# Patient Record
Sex: Male | Born: 1960
Health system: Southern US, Community
[De-identification: ages and names within clinical notes are randomized; demographics above are authoritative.]

## PROBLEM LIST (undated history)

## (undated) DIAGNOSIS — I10 Essential (primary) hypertension: Secondary | ICD-10-CM

## (undated) DIAGNOSIS — R7303 Prediabetes: Secondary | ICD-10-CM

## (undated) DIAGNOSIS — E7800 Pure hypercholesterolemia, unspecified: Secondary | ICD-10-CM

## (undated) DIAGNOSIS — Z720 Tobacco use: Secondary | ICD-10-CM

## (undated) DIAGNOSIS — J302 Other seasonal allergic rhinitis: Secondary | ICD-10-CM

## (undated) HISTORY — DX: Tobacco use: Z72.0

## (undated) HISTORY — PX: DENTAL SURGERY: SHX609

## (undated) HISTORY — DX: Other seasonal allergic rhinitis: J30.2

## (undated) HISTORY — DX: Essential (primary) hypertension: I10

## (undated) HISTORY — DX: Prediabetes: R73.03

## (undated) HISTORY — DX: Pure hypercholesterolemia, unspecified: E78.00

---

## 2017-03-23 ENCOUNTER — Encounter (INDEPENDENT_AMBULATORY_CARE_PROVIDER_SITE_OTHER): Payer: Self-pay | Admitting: Physician Assistant

## 2017-03-23 ENCOUNTER — Ambulatory Visit (INDEPENDENT_AMBULATORY_CARE_PROVIDER_SITE_OTHER): Payer: Medicaid Other | Admitting: Physician Assistant

## 2017-03-23 ENCOUNTER — Ambulatory Visit (HOSPITAL_COMMUNITY)
Admission: RE | Admit: 2017-03-23 | Discharge: 2017-03-23 | Disposition: A | Discharge status: Home / Self Care | Payer: Medicaid Other | Source: Ambulatory Visit | Attending: Physician Assistant | Admitting: Physician Assistant

## 2017-03-23 VITALS — BP 136/89 | HR 64 | Temp 98.7°F | Wt 180.4 lb

## 2017-03-23 DIAGNOSIS — L409 Psoriasis, unspecified: Secondary | ICD-10-CM

## 2017-03-23 MED ORDER — PREDNISONE 10 MG (48) PO TBPK: ORAL_TABLET | ORAL | 0 refills | Status: DC

## 2017-03-23 MED FILL — ?PREDNISONE 10 MG TABLET: 10 | 12 days supply | Qty: 48 | Fill #0

## 2017-03-23 NOTE — Progress Notes (Signed)
   Subjective:  Patient ID: Jared Porter, male    DOB: 01/13/1961  Age: 56 y.o. MRN: [id number]  CC: psoriasis  HPI Jared Porter is a 56 y.o. male with a medical hx of psoriasis presents for treatment of psoriasis. Onset of psoriasis 7 years ago.  Previously received treatment in his [place] country [place] in the form of injections and pills. Psoriasis located on the arms, legs, and some on back and torso. Denies f/c/n/v, pain, GI sxs, GU sxs, CP, SOB, HA, abdominal pain.   * Interpreter services used.  ROS Review of Systems  Constitutional: Negative for chills, fever and malaise/fatigue.  Eyes: Negative for blurred vision.  Respiratory: Negative for shortness of breath.   Cardiovascular: Negative for chest pain and palpitations.  Gastrointestinal: Negative for abdominal pain and nausea.  Genitourinary: Negative for dysuria and hematuria.  Musculoskeletal: Negative for joint pain and myalgias.  Skin: Negative for rash.       Plaques   Neurological: Negative for tingling and headaches.  Psychiatric/Behavioral: Negative for depression. The patient is not nervous/anxious.     Objective:  BP 136/89 (BP Location: Left Arm, Patient Position: Sitting, Cuff Size: Normal)   Pulse 64   Temp 98.7 F (37.1 C) (Oral)   Wt 180 lb 6.4 oz (81.8 kg)   SpO2 97%   BP/Weight 03/23/2017  Systolic BP 136  Diastolic BP 89  Wt. (Lbs) 180.4      Physical Exam  Constitutional: He is oriented to person, place, and time.  Well developed, well nourished, NAD, polite  HENT:  Head: Normocephalic and atraumatic.  Eyes: No scleral icterus.  Cardiovascular: Normal rate, regular rhythm and normal heart sounds.   Pulmonary/Chest: Effort normal and breath sounds normal.  Musculoskeletal: He exhibits no edema.  Neurological: He is alert and oriented to person, place, and time. No cranial nerve deficit. Coordination normal.  Skin:  Large patches of silver scale on arms, legs, abdomen,  and back  Psychiatric: He has a normal mood and affect. His behavior is normal. Thought content normal.  Vitals reviewed.    Assessment & Plan:    1. Psoriasis - HIV antibody - DG Chest 2 View; Future - Comprehensive metabolic panel - CBC with Differential - predniSONE (STERAPRED UNI-PAK 48 TAB) 10 MG (48) TBPK tablet; Take as directed  Dispense: 48 tablet; Refill: 0 - Ambulatory referral to Dermatology   Meds ordered this encounter  Medications  . predniSONE (STERAPRED UNI-PAK 48 TAB) 10 MG (48) TBPK tablet    Sig: Take as directed    Dispense:  48 tablet    Refill:  0    Arabic instructions if available please.    Order Specific Question:   Supervising Provider    Answer:   Quentin AngstJEGEDE, OLUGBEMIGA E [[id number]]    Follow-up: Return in about 4 weeks (around 04/20/2017) for psoriasis.   Loletta Specteroger David Lilyrose Tanney PA

## 2017-03-23 NOTE — Patient Instructions (Addendum)
Please go to the Radiolgy Department at [facility]. You may ask the help desk to help you get to the radiology department.    Please take bus 14 then please take bus 3 which will take you close to [facility]. Once you are done with the xray, please get on bus 3 then please get on bus 14 to bring you back close to your home.    Psoriasis Psoriasis is a long-term (chronic) condition of skin inflammation. It occurs because your immune system causes skin cells to form too quickly. As a result, too many skin cells grow and create raised, red patches (plaques) that look silvery on your skin. Plaques may appear anywhere on your body. They can be any size or shape. Psoriasis can come and go. The condition varies from mild to very severe. It cannot be passed from one person to another (not contagious). What are the causes? The cause of psoriasis is not known, but certain factors can make the condition worse. These include:  Damage or trauma to the skin, such as cuts, scrapes, sunburn, and dryness.  Lack of sunlight.  Certain medicines.  Alcohol.  Tobacco use.  Stress.  Infections caused by bacteria or viruses.  What increases the risk? This condition is more likely to develop in:  People with a family history of psoriasis.  People who are Caucasian.  People who are between the ages of 15-53 and 46-83 years old.  What are the signs or symptoms? There are five different types of psoriasis. You can have more than one type of psoriasis during your life. Types are:  Plaque.  Guttate.  Inverse.  Pustular.  Erythrodermic.  Each type of psoriasis has different symptoms.  Plaque psoriasis symptoms include red, raised plaques with a silvery white coating (scale). These plaques may be itchy. Your nails may be pitted and crumbly or fall off.  Guttate psoriasis symptoms include small red spots that often show up on your trunk, arms, and legs. These spots may develop  after you have been sick, especially with strep throat.  Inverse psoriasis symptoms include plaques in your underarm area, under your breasts, or on your genitals, groin, or buttocks.  Pustular psoriasis symptoms include pus-filled bumps that are painful, red, and swollen on the palms of your hands or the soles of your feet. You also may feel exhausted, feverish, weak, or have no appetite.  Erythrodermic psoriasis symptoms include bright red skin that may look burned. You may have a fast heartbeat and a body temperature that is too high or too low. You may be itchy or in pain.  How is this diagnosed? Your health care provider may suspect psoriasis based on your symptoms and family history. Your health care provider will also do a physical exam. This may include a procedure to remove a tissue sample (biopsy) for testing. You may also be referred to a health care provider who specializes in skin diseases (dermatologist). How is this treated? There is no cure for this condition, but treatment can help manage it. Goals of treatment include:  Helping your skin heal.  Reducing itching and inflammation.  Slowing the growth of new skin cells.  Helping your immune system respond better to your skin.  Treatment varies, depending on the severity of your condition. Treatment may include:  Creams or ointments.  Ultraviolet ray exposure (light therapy). This may include natural sunlight or light therapy in a medical office.  Medicines (systemic therapy). These medicines can help your  body better manage skin cell turnover and inflammation. They may be used along with light therapy or ointments. You may also get antibiotic medicines if you have an infection.  Follow these instructions at home: Skin Care  Moisturize your skin as needed. Only use moisturizers that have been approved by your health care provider.  Apply cool compresses to the affected areas.  Do not scratch your  skin. Lifestyle   Do not use tobacco products. This includes cigarettes, chewing tobacco, and e-cigarettes. If you need help quitting, ask your health care provider.  Drink little or no alcohol.  Try techniques for stress reduction, such as meditation or yoga.  Get exposure to the sun as told by your health care provider. Do not get sunburned.  Consider joining a psoriasis support group. Medicines  Take or use over-the-counter and prescription medicines only as told by your health care provider.  If you were prescribed an antibiotic, take or use it as told by your health care provider. Do not stop taking the antibiotic even if your condition starts to improve. General instructions  Keep a journal to help track what triggers an outbreak. Try to avoid any triggers.  See a counselor or social worker if feelings of sadness, frustration, and hopelessness about your condition are interfering with your work and relationships.  Keep all follow-up visits as told by your health care provider. This is important. Contact a health care provider if:  Your pain gets worse.  You have increasing redness or warmth in the affected areas.  You have new or worsening pain or stiffness in your joints.  Your nails start to break easily or pull away from the nail bed.  You have a fever.  You feel depressed. This information is not intended to replace advice given to you by your health care provider. Make sure you discuss any questions you have with your health care provider. Document Released: 07/22/2000 Document Revised: 12/31/2015 Document Reviewed: 12/10/2014 Elsevier Interactive Patient Education  2018 [employer]

## 2017-03-25 LAB — CBC WITH DIFFERENTIAL/PLATELET
BASOS ABS: 0 10*3/uL (ref 0.0–0.2)
Basos: 1 %
EOS (ABSOLUTE): 0.3 10*3/uL (ref 0.0–0.4)
Eos: 6 %
Hematocrit: 43.9 % (ref 37.5–51.0)
Hemoglobin: 14.5 g/dL (ref 13.0–17.7)
IMMATURE GRANULOCYTES: 0 %
Immature Grans (Abs): 0 10*3/uL (ref 0.0–0.1)
LYMPHS ABS: 1.7 10*3/uL (ref 0.7–3.1)
Lymphs: 34 %
MCH: 30.3 pg (ref 26.6–33.0)
MCHC: 33 g/dL (ref 31.5–35.7)
MCV: 92 fL (ref 79–97)
MONOCYTES: 11 %
MONOS ABS: 0.5 10*3/uL (ref 0.1–0.9)
NEUTROS PCT: 48 %
Neutrophils Absolute: 2.4 10*3/uL (ref 1.4–7.0)
Platelets: 242 10*3/uL (ref 150–379)
RBC: 4.78 x10E6/uL (ref 4.14–5.80)
RDW: 14 % (ref 12.3–15.4)
WBC: 4.9 10*3/uL (ref 3.4–10.8)

## 2017-03-25 LAB — COMPREHENSIVE METABOLIC PANEL
ALBUMIN: 4.3 g/dL (ref 3.5–5.5)
ALK PHOS: 69 IU/L (ref 39–117)
ALT: 23 IU/L (ref 0–44)
AST: 18 IU/L (ref 0–40)
Albumin/Globulin Ratio: 1.7 (ref 1.2–2.2)
BILIRUBIN TOTAL: 0.3 mg/dL (ref 0.0–1.2)
BUN / CREAT RATIO: 12 (ref 9–20)
BUN: 10 mg/dL (ref 6–24)
CO2: 27 mmol/L (ref 20–29)
Calcium: 9.6 mg/dL (ref 8.7–10.2)
Chloride: 104 mmol/L (ref 96–106)
Creatinine, Ser: 0.82 mg/dL (ref 0.76–1.27)
GFR calc Af Amer: 114 mL/min/{1.73_m2} (ref >59)
GFR calc non Af Amer: 99 mL/min/{1.73_m2} (ref >59)
GLUCOSE: 83 mg/dL (ref 65–99)
Globulin, Total: 2.6 g/dL (ref 1.5–4.5)
Potassium: 4.2 mmol/L (ref 3.5–5.2)
SODIUM: 144 mmol/L (ref 134–144)
Total Protein: 6.9 g/dL (ref 6.0–8.5)

## 2017-03-25 LAB — HIV ANTIBODY (ROUTINE TESTING W REFLEX): HIV SCREEN 4TH GENERATION: NONREACTIVE

## 2017-03-28 ENCOUNTER — Telehealth (INDEPENDENT_AMBULATORY_CARE_PROVIDER_SITE_OTHER): Payer: Self-pay | Admitting: *Deleted

## 2017-03-28 NOTE — Telephone Encounter (Signed)
Medical Assistant used [employer] to contact patient.  Interpreter Name: Daniel Nones #: [id number] Patients case manager accepted the messaged of patients labs being normal. No further questions and the case manager will relay the results to the patient.

## 2017-03-28 NOTE — Telephone Encounter (Signed)
-----   Message from Loletta Specter, PA-C sent at 03/27/2017  1:36 PM EDT ----- All labs normal.

## 2017-04-20 ENCOUNTER — Ambulatory Visit (INDEPENDENT_AMBULATORY_CARE_PROVIDER_SITE_OTHER): Payer: Medicaid Other | Admitting: Physician Assistant

## 2017-04-27 ENCOUNTER — Ambulatory Visit (INDEPENDENT_AMBULATORY_CARE_PROVIDER_SITE_OTHER): Payer: Medicaid Other | Admitting: Physician Assistant

## 2017-05-08 ENCOUNTER — Ambulatory Visit (INDEPENDENT_AMBULATORY_CARE_PROVIDER_SITE_OTHER): Payer: Medicaid Other | Admitting: Physician Assistant

## 2017-05-08 ENCOUNTER — Encounter (INDEPENDENT_AMBULATORY_CARE_PROVIDER_SITE_OTHER): Payer: Self-pay | Admitting: Physician Assistant

## 2017-05-08 VITALS — BP 124/87 | HR 73 | Temp 97.9°F | Wt 187.0 lb

## 2017-05-08 DIAGNOSIS — Z79899 Other long term (current) drug therapy: Secondary | ICD-10-CM

## 2017-05-08 DIAGNOSIS — L409 Psoriasis, unspecified: Secondary | ICD-10-CM | POA: Diagnosis not present

## 2017-05-08 LAB — POCT GLYCOSYLATED HEMOGLOBIN (HGB A1C): HEMOGLOBIN A1C: 5.6

## 2017-05-08 MED ORDER — METHOTREXATE SODIUM 10 MG PO TABS: 10.0000 mg | ORAL_TABLET | ORAL | 1 refills | Status: DC

## 2017-05-08 MED ORDER — FOLIC ACID 1 MG PO TABS: 1.0000 mg | ORAL_TABLET | Freq: Every day | ORAL | 1 refills | Status: DC

## 2017-05-08 NOTE — Progress Notes (Signed)
Subjective:  Patient ID: Jared Porter, male    DOB: [birth year]  Age: 56 y.o. MRN: [id number]  CC: psoriasis  HPI Jared Porter is a 56 y.o. male with a medical history of severe psoriasis presents to f/u on psoriasis. He has prescribed Sterapred uni-pak 48 tablets. Referred to dermatology but has not heard back yet. Recent HIV, CMP, and CBC were all normal. Says he did well on Prednisone and noticed his bilateral knee pain decreased significantly. Has since run out of prednisone and requests medication to help with his recurring knee pain. Does not endorse any other symptoms or complaints.   * Arabic interpretation services used for this encounter.    Outpatient Medications Prior to Visit  Medication Sig Dispense Refill  . predniSONE (STERAPRED UNI-PAK 48 TAB) 10 MG (48) TBPK tablet Take as directed (Patient not taking: Reported on 05/08/2017) 48 tablet 0   No facility-administered medications prior to visit.      ROS Review of Systems  Constitutional: Negative for chills, fever and malaise/fatigue.  Eyes: Negative for blurred vision.  Respiratory: Negative for shortness of breath.   Cardiovascular: Negative for chest pain and palpitations.  Gastrointestinal: Negative for abdominal pain and nausea.  Genitourinary: Negative for dysuria and hematuria.  Musculoskeletal: Positive for joint pain. Negative for myalgias.  Skin: Positive for rash.  Neurological: Negative for tingling and headaches.  Psychiatric/Behavioral: Negative for depression. The patient is not nervous/anxious.     Objective:  BP 124/87 (BP Location: Left Arm, Patient Position: Sitting, Cuff Size: Normal)   Pulse 73   Temp 97.9 F (36.6 C) (Oral)   Wt 187 lb (84.8 kg)   SpO2 98%   BP/Weight 05/08/2017 03/23/2017  Systolic BP 124 136  Diastolic BP 87 89  Wt. (Lbs) 187 180.4      Physical Exam  Constitutional: He is oriented to person, place, and time.  Well developed, well nourished,  NAD, polite  HENT:  Head: Normocephalic and atraumatic.  Eyes: No scleral icterus.  Neck: Normal range of motion. Neck supple. No thyromegaly present.  Cardiovascular: Normal rate, regular rhythm and normal heart sounds.   Pulmonary/Chest: Effort normal and breath sounds normal.  Abdominal: Soft. Bowel sounds are normal. There is no tenderness.  Musculoskeletal: He exhibits no edema.  Neurological: He is alert and oriented to person, place, and time. No cranial nerve deficit. Coordination normal.  Skin: Skin is warm and dry.  Severe and thick psoriatic plaque build up on all the extremities and torso.  Psychiatric: He has a normal mood and affect. His behavior is normal. Thought content normal.  Vitals reviewed.    Assessment & Plan:    1. Psoriasis - Dermatology appointment in 10 days. - HgB A1c 5.6% in clinic today. - Lipid Panel - Hepatitis panel, acute - Begin folic acid (FOLVITE) 1 MG tablet; Take 1 tablet (1 mg total) by mouth daily.  Dispense: 90 tablet; Refill: 1 - Begin methotrexate (RHEUMATREX) 10 MG tablet; Take 1 tablet (10 mg total) by mouth once a week. Caution: Chemotherapy. Protect from light.  Dispense: 12 tablet; Refill: 1  2. High risk medication use - HgB A1c 5.6% in clinic today - Lipid Panel - Hepatitis panel, acute - Begin folic acid (FOLVITE) 1 MG tablet; Take 1 tablet (1 mg total) by mouth daily.  Dispense: 90 tablet; Refill: 1 - Begin methotrexate (RHEUMATREX) 10 MG tablet; Take 1 tablet (10 mg total) by mouth once a week. Caution: Chemotherapy. Protect from light.  Dispense: 12  tablet; Refill: 1  * Patient was advised to wait on results before filling prescription. We will be calling within 48 hours to report his results and whether or not he may start methotrexate. His recent CXR was normal.  Meds ordered this encounter  Medications  . folic acid (FOLVITE) 1 MG tablet    Sig: Take 1 tablet (1 mg total) by mouth daily.    Dispense:  90 tablet     Refill:  1    Order Specific Question:   Supervising Provider    Answer:   Quentin Angst [id number]  . methotrexate (RHEUMATREX) 10 MG tablet    Sig: Take 1 tablet (10 mg total) by mouth once a week. Caution: Chemotherapy. Protect from light.    Dispense:  12 tablet    Refill:  1    Order Specific Question:   Supervising Provider    Answer:   Quentin Angst [id number]    Follow-up: Return in about 4 weeks (around 06/05/2017) for psoriasis.   Loletta Specter PA

## 2017-05-09 ENCOUNTER — Other Ambulatory Visit (INDEPENDENT_AMBULATORY_CARE_PROVIDER_SITE_OTHER): Payer: Self-pay | Admitting: Physician Assistant

## 2017-05-09 DIAGNOSIS — E7841 Elevated Lipoprotein(a): Secondary | ICD-10-CM

## 2017-05-09 LAB — HEPATITIS PANEL, ACUTE
HEP B C IGM: NEGATIVE
Hep A IgM: NEGATIVE
Hepatitis B Surface Ag: NEGATIVE

## 2017-05-09 LAB — LIPID PANEL
CHOLESTEROL TOTAL: 219 mg/dL — AB (ref 100–199)
Chol/HDL Ratio: 4.1 ratio (ref 0.0–5.0)
HDL: 54 mg/dL (ref >39)
LDL Calculated: 131 mg/dL — ABNORMAL HIGH (ref 0–99)
TRIGLYCERIDES: 169 mg/dL — AB (ref 0–149)
VLDL Cholesterol Cal: 34 mg/dL (ref 5–40)

## 2017-05-09 MED ORDER — PRAVASTATIN SODIUM 40 MG PO TABS: 40.0000 mg | ORAL_TABLET | Freq: Every day | ORAL | 3 refills | Status: DC

## 2017-05-10 ENCOUNTER — Telehealth (INDEPENDENT_AMBULATORY_CARE_PROVIDER_SITE_OTHER): Payer: Self-pay

## 2017-05-10 ENCOUNTER — Encounter (INDEPENDENT_AMBULATORY_CARE_PROVIDER_SITE_OTHER): Payer: Self-pay

## 2017-05-10 NOTE — Telephone Encounter (Signed)
Using [employer] [phone] attempted to call patient, interpreter informed me that there was a problem with the number and that the call was not going through. Will mail results to the patient . Maryjean Morn, CMA

## 2017-05-10 NOTE — Telephone Encounter (Signed)
-----   Message from Loletta Specter, PA-C sent at 05/09/2017  8:42 AM EDT ----- Hepatitis negative. Cholesterol elevated. I will send out Pravastatin to [facility]. He may also begin the Folic acid and Methotrexate that I prescribed. He should return in one month for repeat liver testing while he is on methotrexate.

## 2017-05-18 ENCOUNTER — Emergency Department (HOSPITAL_COMMUNITY): Admission: EM | Admit: 2017-05-18 | Payer: Medicaid Other | Source: Home / Self Care

## 2017-05-18 ENCOUNTER — Ambulatory Visit: Payer: Medicaid Other

## 2017-06-01 ENCOUNTER — Ambulatory Visit: Payer: Medicaid Other

## 2017-06-02 ENCOUNTER — Ambulatory Visit (INDEPENDENT_AMBULATORY_CARE_PROVIDER_SITE_OTHER): Payer: Medicaid Other | Admitting: Family Medicine

## 2017-06-02 ENCOUNTER — Encounter: Payer: Self-pay | Admitting: Family Medicine

## 2017-06-02 VITALS — BP 118/78 | HR 79 | Temp 97.9°F | Wt 193.0 lb

## 2017-06-02 DIAGNOSIS — L409 Psoriasis, unspecified: Secondary | ICD-10-CM | POA: Diagnosis not present

## 2017-06-02 DIAGNOSIS — M25562 Pain in left knee: Secondary | ICD-10-CM

## 2017-06-02 DIAGNOSIS — M25561 Pain in right knee: Secondary | ICD-10-CM | POA: Diagnosis not present

## 2017-06-02 DIAGNOSIS — R21 Rash and other nonspecific skin eruption: Secondary | ICD-10-CM

## 2017-06-02 NOTE — Progress Notes (Addendum)
Subjective:     Patient ID: Jared Porter, male   DOB: August 17, 1960, 56 y.o.   MRN: [id number]\ [facility] interpreter used for Arabic.  Rash  This is a chronic problem. The current episode started more than 1 year ago (10 yrs). The problem has been waxing and waning since onset. Location: ash is located on his arms and legs. The rash is characterized by dryness, peeling, scaling and itchiness. He was exposed to nothing (She went from [place] to [place] 10 yrs ago and after he got the disease). Associated symptoms include joint pain. Pertinent negatives include no anorexia, eye pain, fever or vomiting. (Occasional knee pain which improved with his Methotrexate) There is no history of eczema.  Currently on Methotrexate and Folate. He is uncertain why he is on it.  Current Outpatient Prescriptions on File Prior to Visit  Medication Sig Dispense Refill  . folic acid (FOLVITE) 1 MG tablet Take 1 tablet (1 mg total) by mouth daily. 90 tablet 1  . methotrexate (RHEUMATREX) 10 MG tablet Take 1 tablet (10 mg total) by mouth once a week. Caution: Chemotherapy. Protect from light. 12 tablet 1  . pravastatin (PRAVACHOL) 40 MG tablet Take 1 tablet (40 mg total) by mouth daily. (Patient not taking: Reported on 06/02/2017) 90 tablet 3  . predniSONE (STERAPRED UNI-PAK 48 TAB) 10 MG (48) TBPK tablet Take as directed (Patient not taking: Reported on 05/08/2017) 48 tablet 0   No current facility-administered medications on file prior to visit.    History reviewed. No pertinent past medical history.   Review of Systems  Constitutional: Negative for fever.  Eyes: Negative for pain.  Respiratory: Negative.   Cardiovascular: Negative.   Gastrointestinal: Negative.  Negative for anorexia and vomiting.  Musculoskeletal: Positive for joint pain.  Skin: Positive for rash.  All other systems reviewed and are negative.      Objective:   Physical Exam  Constitutional: He is oriented to person, place, and time. He  appears well-developed. No distress.  Cardiovascular: Normal rate, regular rhythm and normal heart sounds.   No murmur heard. Pulmonary/Chest: Effort normal. No respiratory distress. He has no wheezes. He exhibits no tenderness.  Neurological: He is alert and oriented to person, place, and time.  Skin:     Nursing note and vitals reviewed.            Assessment:     Unspecific skin rash: Likely severe Psoriasis    Plan:     My guess it that he was placed on Methotrexate for this. I recommended biopsy today to further determine his type of rash. Autoimmune labs also were ordered due to associated joint pain. Informed consent obtained for procedure via the help of an interpreter.   PRE-OP DIAGNOSIS: Hyperkeratotic skin rash POST-OP DIAGNOSIS: Same  PROCEDURE: skin biopsy Performing Physician: Janit Pagan, MD, MPH S   PROCEDURE:  _  Shave Biopsy        _  Excisional Biopsy       X  Punch (Size 4mm)   The area surrounding the skin lesion was prepared and draped in the usual sterile manner. The lesion was removed in the usual manner by the biopsy method noted above on his left shin. Hemostasis was assured. The patient tolerated the procedure well.   Closure:     Silver nitrate for hemostasis.   Specimen sent to the lab for pathology eval.  Followup: The patient tolerated the procedure well without complications.  Standard post-procedure care is explained and  return precautions are given.  Follow-up with PCP for other health conditions and health maintenance.

## 2017-06-02 NOTE — Addendum Note (Signed)
Addended by: Jennette BillBUSICK, Vontrell Pullman L on: 06/02/2017 03:34 PM   Modules accepted: Orders

## 2017-06-02 NOTE — Patient Instructions (Signed)
Contact Dermatitis Dermatitis is redness, soreness, and swelling (inflammation) of the skin. Contact dermatitis is a reaction to certain substances that touch the skin. You either touched something that irritated your skin, or you have allergies to something you touched. Follow these instructions at home: Skin Care  Moisturize your skin as needed.  Apply cool compresses to the affected areas.  Try taking a bath with: ? Epsom salts. Follow the instructions on the package. You can get these at a pharmacy or grocery store. ? Baking soda. Pour a small amount into the bath as told by your doctor. ? Colloidal oatmeal. Follow the instructions on the package. You can get this at a pharmacy or grocery store.  Try applying baking soda paste to your skin. Stir water into baking soda until it looks like paste.  Do not scratch your skin.  Bathe less often.  Bathe in lukewarm water. Avoid using hot water. Medicines  Take or apply over-the-counter and prescription medicines only as told by your doctor.  If you were prescribed an antibiotic medicine, take or apply your antibiotic as told by your doctor. Do not stop taking the antibiotic even if your condition starts to get better. General instructions  Keep all follow-up visits as told by your doctor. This is important.  Avoid the substance that caused your reaction. If you do not know what caused it, keep a journal to try to track what caused it. Write down: ? What you eat. ? What cosmetic products you use. ? What you drink. ? What you wear in the affected area. This includes jewelry.  If you were given a bandage (dressing), take care of it as told by your doctor. This includes when to change and remove it. Contact a doctor if:  You do not get better with treatment.  Your condition gets worse.  You have signs of infection such as: ? Swelling. ? Tenderness. ? Redness. ? Soreness. ? Warmth.  You have a fever.  You have new  symptoms. Get help right away if:  You have a very bad headache.  You have neck pain.  Your neck is stiff.  You throw up (vomit).  You feel very sleepy.  You see red streaks coming from the affected area.  Your bone or joint underneath the affected area becomes painful after the skin has healed.  The affected area turns darker.  You have trouble breathing. This information is not intended to replace advice given to you by your health care provider. Make sure you discuss any questions you have with your health care provider. Document Released: 05/22/2009 Document Revised: 12/31/2015 Document Reviewed: 12/10/2014 Elsevier Interactive Patient Education  2018 Elsevier Inc.  

## 2017-06-03 LAB — SEDIMENTATION RATE: SED RATE: 9 mm/h (ref 0–30)

## 2017-06-04 LAB — ANA: ANA: NEGATIVE

## 2017-06-04 LAB — RHEUMATOID ARTHRITIS PROFILE
CYCLIC CITRULLIN PEPTIDE AB: 6 U (ref 0–19)
Rhuematoid fact SerPl-aCnc: <10 IU/mL (ref 0.0–13.9)

## 2017-06-05 LAB — C-REACTIVE PROTEIN: CRP: 8 mg/L — ABNORMAL HIGH (ref 0.0–4.9)

## 2017-06-05 LAB — SPECIMEN STATUS REPORT

## 2017-06-06 ENCOUNTER — Telehealth: Payer: Self-pay | Admitting: Family Medicine

## 2017-06-06 NOTE — Telephone Encounter (Signed)
Noted  

## 2017-06-06 NOTE — Telephone Encounter (Addendum)
Message left for him to call back to discuss result and plan.   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 I called and spoke with the pathologist who read the report Dorene Grebe([facility]). She stated it could be psoriasis especially if it is multiple lesions; she recommended repeating biopsy from multiple sites for reassessment.   Plan to [facility]. Appointment may be double booked.

## 2017-06-07 ENCOUNTER — Telehealth: Payer: Self-pay | Admitting: Family Medicine

## 2017-06-07 NOTE — Telephone Encounter (Signed)
Pathology result discussed with patient. Appointment scheduled for Nov 13. All questions were answered.   Interpreter ID#: [id number]

## 2017-06-20 ENCOUNTER — Ambulatory Visit: Payer: Medicaid Other | Admitting: Family Medicine

## 2017-06-23 ENCOUNTER — Ambulatory Visit: Payer: Medicaid Other | Admitting: Family Medicine

## 2017-09-14 ENCOUNTER — Other Ambulatory Visit: Payer: Self-pay

## 2017-09-14 ENCOUNTER — Ambulatory Visit (INDEPENDENT_AMBULATORY_CARE_PROVIDER_SITE_OTHER): Payer: Medicaid Other | Admitting: Family Medicine

## 2017-09-14 VITALS — BP 106/68 | HR 66 | Temp 98.2°F | Wt 177.2 lb

## 2017-09-14 DIAGNOSIS — L308 Other specified dermatitis: Secondary | ICD-10-CM | POA: Diagnosis not present

## 2017-09-14 DIAGNOSIS — L989 Disorder of the skin and subcutaneous tissue, unspecified: Secondary | ICD-10-CM

## 2017-09-14 NOTE — Patient Instructions (Signed)
Psoriasis  Psoriasis is a long-term (chronic) skin condition. It causes raised, red patches (plaques) on your skin that look silvery. The red patches may show up anywhere on your body. They can be any size or shape.  Psoriasis can come and go. It can range from mild to very bad. It cannot be passed from one person to another (not contagious). There is no cure for this condition, but it can be helped with treatment.  Follow these instructions at home:  Skin Care   Apply moisturizers to your skin as needed. Only use those that your doctor has said are okay.   Apply cool compresses to the affected areas.   Do not scratch your skin.  Lifestyle     Do not use tobacco products. This includes cigarettes, chewing tobacco, and e-cigarettes. If you need help quitting, ask your doctor.   Drink little or no alcohol.   Try to lower your stress. Meditation or yoga may help.   Get sun as told by your doctor. Do not get sunburned.   Think about joining a psoriasis support group.  Medicines   Take or use over-the-counter and prescription medicines only as told by your doctor.   If you were prescribed an antibiotic, take or use it as told by your doctor. Do not stop taking the antibiotic even if your condition starts to get better.  General instructions   Keep a journal. Use this to help track what triggers an outbreak. Try to avoid any triggers.   See a counselor or social worker if you feel very sad, upset, or hopeless about your condition and these feelings affect your work or relationships.   Keep all follow-up visits as told by your doctor. This is important.  Contact a doctor if:   Your pain gets worse.   You have more redness or warmth in the affected areas.   You have new pain or stiffness in your joints.   Your pain or stiffness in your joints gets worse.   Your nails start to break easily.   Your nails pull away from the nail bed easily.   You have a fever.   You feel very sad (depressed).  This  information is not intended to replace advice given to you by your health care provider. Make sure you discuss any questions you have with your health care provider.  Document Released: 09/01/2004 Document Revised: 12/31/2015 Document Reviewed: 12/10/2014  Elsevier Interactive Patient Education  2018 Elsevier Inc.

## 2017-09-14 NOTE — Progress Notes (Signed)
Subjective:     Patient ID: Jared Porter, male   DOB: 12/20/1960, 57 y.o.   MRN: [id number]  HPI Skin lesion: Patient here for follow-up. He has been compliant with Methotrexate and he feels this has help improve his lesion a lot. He is here for biopsy follow-up.  Current Outpatient Medications on File Prior to Visit  Medication Sig Dispense Refill  . folic acid (FOLVITE) 1 MG tablet Take 1 tablet (1 mg total) by mouth daily. 90 tablet 1  . methotrexate (RHEUMATREX) 10 MG tablet Take 1 tablet (10 mg total) by mouth once a week. Caution: Chemotherapy. Protect from light. 12 tablet 1  . pravastatin (PRAVACHOL) 40 MG tablet Take 1 tablet (40 mg total) by mouth daily. (Patient not taking: Reported on 06/02/2017) 90 tablet 3  . predniSONE (STERAPRED UNI-PAK 48 TAB) 10 MG (48) TBPK tablet Take as directed (Patient not taking: Reported on 05/08/2017) 48 tablet 0   No current facility-administered medications on file prior to visit.    History reviewed. No pertinent past medical history. Vitals:   09/14/17 1330  BP: 106/68  Pulse: 66  Temp: 98.2 F (36.8 C)  TempSrc: Oral  SpO2: 97%  Weight: 177 lb 3.2 oz (80.4 kg)     Review of Systems  Respiratory: Negative.   Cardiovascular: Negative.   Gastrointestinal: Negative.   Skin: Positive for rash.  All other systems reviewed and are negative.      Objective:   Physical Exam  Skin:     Nursing note and vitals reviewed.      Assessment:     Wide spread skin lesion: R/O Psoriasis vs Verucca    Plan:     PRE-OP DIAGNOSIS:Skin lesion R/O Psoriasus vs Verucca vulgaris.  POST-OP DIAGNOSIS: Same   PROCEDURE: skin punch biopsy  Performing Physician: Janit PaganKehinde Susana Duell, MD, MPH  Supervising Physician (if applicable):N/A   PROCEDURE:   Punch (Size 3mm)   The area surrounding the skin lesion was prepared and draped in the usual sterile manner. The lesion was removed in the usual manner by the biopsy method noted above.  Hemostasis was assured. The patient tolerated the procedure well.  Sample was taken from his left upper and lower limb as well as left upper back   Closure:     X  Monsel's for hemostasis and area was covered with plastic bandage.     Followup: The patient tolerated the procedure well without complications.  Standard post-procedure care is explained and return precautions are given.  We will contact him soon with biopsy result.

## 2017-09-18 ENCOUNTER — Telehealth: Payer: Self-pay | Admitting: Family Medicine

## 2017-09-18 NOTE — Telephone Encounter (Signed)
I called and discussed biopsy result with the patient. +Psoriasis as suspected with good response to Methotrexate. May continue same. If no complete resolution to consider Cosentx.  Message forwarded to his PCP.

## 2017-10-27 ENCOUNTER — Other Ambulatory Visit (INDEPENDENT_AMBULATORY_CARE_PROVIDER_SITE_OTHER): Payer: Self-pay | Admitting: Physician Assistant

## 2017-10-27 DIAGNOSIS — Z79899 Other long term (current) drug therapy: Secondary | ICD-10-CM

## 2017-10-27 DIAGNOSIS — L409 Psoriasis, unspecified: Secondary | ICD-10-CM

## 2017-10-27 NOTE — Telephone Encounter (Signed)
FWD to PCP. Maryjean Mornempestt S Sante Biedermann, CMA   Patient does have [place]. [facility], issue is [employer] wants patient to use mail order. Kathie RhodesBetty will call insurance company to have them switch it so that the patient is able to pick up at pharmacy. Maryjean Mornempestt S Vic Esco, CMA

## 2017-12-01 ENCOUNTER — Other Ambulatory Visit (INDEPENDENT_AMBULATORY_CARE_PROVIDER_SITE_OTHER): Payer: Self-pay | Admitting: Physician Assistant

## 2017-12-01 NOTE — Telephone Encounter (Signed)
FWD to PCP. Leahna Hewson S Ozzie Remmers, CMA  

## 2018-01-12 ENCOUNTER — Ambulatory Visit (INDEPENDENT_AMBULATORY_CARE_PROVIDER_SITE_OTHER): Payer: No Typology Code available for payment source | Admitting: Physician Assistant

## 2018-01-12 ENCOUNTER — Other Ambulatory Visit: Payer: Self-pay

## 2018-01-12 ENCOUNTER — Encounter (INDEPENDENT_AMBULATORY_CARE_PROVIDER_SITE_OTHER): Payer: Self-pay | Admitting: Physician Assistant

## 2018-01-12 VITALS — BP 118/78 | HR 64 | Temp 97.7°F | Resp 12 | Wt 187.0 lb

## 2018-01-12 DIAGNOSIS — M25562 Pain in left knee: Secondary | ICD-10-CM | POA: Diagnosis not present

## 2018-01-12 DIAGNOSIS — E7841 Elevated Lipoprotein(a): Secondary | ICD-10-CM

## 2018-01-12 DIAGNOSIS — G8929 Other chronic pain: Secondary | ICD-10-CM

## 2018-01-12 DIAGNOSIS — M25561 Pain in right knee: Secondary | ICD-10-CM

## 2018-01-12 DIAGNOSIS — L409 Psoriasis, unspecified: Secondary | ICD-10-CM

## 2018-01-12 DIAGNOSIS — Z79899 Other long term (current) drug therapy: Secondary | ICD-10-CM

## 2018-01-12 MED ORDER — METHOTREXATE SODIUM 10 MG PO TABS: 10.0000 mg | ORAL_TABLET | ORAL | 1 refills | Status: DC

## 2018-01-12 MED ORDER — METHOTREXATE SODIUM 2.5 MG PO TABS: ORAL_TABLET | ORAL | 3 refills | Status: DC

## 2018-01-12 MED ORDER — NAPROXEN 500 MG PO TABS: 500.0000 mg | ORAL_TABLET | Freq: Two times a day (BID) | ORAL | 0 refills | Status: DC

## 2018-01-12 MED ORDER — FOLIC ACID 1 MG PO TABS: 1.0000 mg | ORAL_TABLET | Freq: Every day | ORAL | 3 refills | Status: DC

## 2018-01-12 NOTE — Progress Notes (Signed)
methotrexate d/c  and needs rf

## 2018-01-12 NOTE — Patient Instructions (Signed)
Psoriasis Psoriasis is a long-term (chronic) condition of skin inflammation. It occurs because your immune system causes skin cells to form too quickly. As a result, too many skin cells grow and create raised, red patches (plaques) that look silvery on your skin. Plaques may appear anywhere on your body. They can be any size or shape. Psoriasis can come and go. The condition varies from mild to very severe. It cannot be passed from one person to another (not contagious). What are the causes? The cause of psoriasis is not known, but certain factors can make the condition worse. These include:  Damage or trauma to the skin, such as cuts, scrapes, sunburn, and dryness.  Lack of sunlight.  Certain medicines.  Alcohol.  Tobacco use.  Stress.  Infections caused by bacteria or viruses.  What increases the risk? This condition is more likely to develop in:  People with a family history of psoriasis.  People who are Caucasian.  People who are between the ages of 15-30 and 50-60 years old.  What are the signs or symptoms? There are five different types of psoriasis. You can have more than one type of psoriasis during your life. Types are:  Plaque.  Guttate.  Inverse.  Pustular.  Erythrodermic.  Each type of psoriasis has different symptoms.  Plaque psoriasis symptoms include red, raised plaques with a silvery white coating (scale). These plaques may be itchy. Your nails may be pitted and crumbly or fall off.  Guttate psoriasis symptoms include small red spots that often show up on your trunk, arms, and legs. These spots may develop after you have been sick, especially with strep throat.  Inverse psoriasis symptoms include plaques in your underarm area, under your breasts, or on your genitals, groin, or buttocks.  Pustular psoriasis symptoms include pus-filled bumps that are painful, red, and swollen on the palms of your hands or the soles of your feet. You also may feel  exhausted, feverish, weak, or have no appetite.  Erythrodermic psoriasis symptoms include bright red skin that may look burned. You may have a fast heartbeat and a body temperature that is too high or too low. You may be itchy or in pain.  How is this diagnosed? Your health care provider may suspect psoriasis based on your symptoms and family history. Your health care provider will also do a physical exam. This may include a procedure to remove a tissue sample (biopsy) for testing. You may also be referred to a health care provider who specializes in skin diseases (dermatologist). How is this treated? There is no cure for this condition, but treatment can help manage it. Goals of treatment include:  Helping your skin heal.  Reducing itching and inflammation.  Slowing the growth of new skin cells.  Helping your immune system respond better to your skin.  Treatment varies, depending on the severity of your condition. Treatment may include:  Creams or ointments.  Ultraviolet ray exposure (light therapy). This may include natural sunlight or light therapy in a medical office.  Medicines (systemic therapy). These medicines can help your body better manage skin cell turnover and inflammation. They may be used along with light therapy or ointments. You may also get antibiotic medicines if you have an infection.  Follow these instructions at home: Skin Care  Moisturize your skin as needed. Only use moisturizers that have been approved by your health care provider.  Apply cool compresses to the affected areas.  Do not scratch your skin. Lifestyle   Do not   use tobacco products. This includes cigarettes, chewing tobacco, and e-cigarettes. If you need help quitting, ask your health care provider.  Drink little or no alcohol.  Try techniques for stress reduction, such as meditation or yoga.  Get exposure to the sun as told by your health care provider. Do not get sunburned.  Consider  joining a psoriasis support group. Medicines  Take or use over-the-counter and prescription medicines only as told by your health care provider.  If you were prescribed an antibiotic, take or use it as told by your health care provider. Do not stop taking the antibiotic even if your condition starts to improve. General instructions  Keep a journal to help track what triggers an outbreak. Try to avoid any triggers.  See a counselor or social worker if feelings of sadness, frustration, and hopelessness about your condition are interfering with your work and relationships.  Keep all follow-up visits as told by your health care provider. This is important. Contact a health care provider if:  Your pain gets worse.  You have increasing redness or warmth in the affected areas.  You have new or worsening pain or stiffness in your joints.  Your nails start to break easily or pull away from the nail bed.  You have a fever.  You feel depressed. This information is not intended to replace advice given to you by your health care provider. Make sure you discuss any questions you have with your health care provider. Document Released: 07/22/2000 Document Revised: 12/31/2015 Document Reviewed: 12/10/2014 Elsevier Interactive Patient Education  2018 Elsevier Inc.  

## 2018-01-12 NOTE — Progress Notes (Signed)
Subjective:  Patient ID: Jared Porter, male    DOB: Sep 02, 1960  Age: 57 y.o. MRN: [id number]  CC: refill   HPI Jared Porter is a 57 y.o. male with a medical history of psoriasis presents to f/u on psoriasis. Has been seen my dermatology and advised to continue on methotrexate 10 mg per week along with folic acid. Pt has since run out of methotrexate and is requesting refill. Pt states he is feeling well and his psoriasis is slowly clearing. Only complaint is of bilateral knee pain since approximately 3-4 years ago. Knees are not tender to palpation and only hurt when he squats. Does not remember injury to the knees. Does not have gait impairment nor does bilateral knee pain affect his daily living. No other arthralgias. Does not endorse any other symptoms or complaints.   Video translator used during encounter.   Outpatient Medications Prior to Visit  Medication Sig Dispense Refill  . folic acid (FOLVITE) 1 MG tablet TAKE 1 TABLET BY MOUTH ONCE DAILY 90 tablet 1  . methotrexate 2.5 MG tablet TAKE 4 TABLETS BY MOUTH ONCE A WEEK 48 tablet 1  . methotrexate (RHEUMATREX) 10 MG tablet Take 1 tablet (10 mg total) by mouth once a week. Caution: Chemotherapy. Protect from light. 12 tablet 1  . pravastatin (PRAVACHOL) 40 MG tablet Take 1 tablet (40 mg total) by mouth daily. (Patient not taking: Reported on 06/02/2017) 90 tablet 3  . predniSONE (STERAPRED UNI-PAK 48 TAB) 10 MG (48) TBPK tablet Take as directed (Patient not taking: Reported on 05/08/2017) 48 tablet 0   No facility-administered medications prior to visit.      ROS Review of Systems  Constitutional: Negative for chills, fever and malaise/fatigue.  Eyes: Negative for blurred vision.  Respiratory: Negative for shortness of breath.   Cardiovascular: Negative for chest pain and palpitations.  Gastrointestinal: Negative for abdominal pain and nausea.  Genitourinary: Negative for dysuria and hematuria.   Musculoskeletal: Positive for joint pain. Negative for myalgias.  Skin: Negative for rash.       Psoriasis plaques  Neurological: Negative for tingling and headaches.  Psychiatric/Behavioral: Negative for depression. The patient is not nervous/anxious.     Objective:  BP 118/78 (BP Location: Left Arm, Patient Position: Sitting, Cuff Size: Normal)   Pulse 64   Temp 97.7 F (36.5 C) (Oral)   Resp 12   Wt 187 lb (84.8 kg)   SpO2 100%   BP/Weight 01/12/2018 09/14/2017 06/02/2017  Systolic BP 118 106 118  Diastolic BP 78 68 78  Wt. (Lbs) 187 177.2 193      Physical Exam  Constitutional: He is oriented to person, place, and time.  Well developed, thin, NAD, polite  HENT:  Head: Normocephalic and atraumatic.  Eyes: No scleral icterus.  Neck: Normal range of motion. Neck supple. No thyromegaly present.  Cardiovascular: Normal rate, regular rhythm and normal heart sounds.  Pulmonary/Chest: Effort normal and breath sounds normal.  Abdominal: Soft. Bowel sounds are normal. There is no tenderness.  Musculoskeletal: He exhibits no edema.  No tenderness to palpation of the bilateral knee. Negative anterior/posterior drawer, varus/valgus stress testing, and patellar apprehension. Normal aROM of the knee bilaterally.  Neurological: He is alert and oriented to person, place, and time.  Skin: Skin is warm and dry. No erythema. No pallor.  Extensive large patches of fine silvery scale on the extremities and torso.   Psychiatric: He has a normal mood and affect. His behavior is normal. Thought content normal.  Vitals reviewed.    Assessment & Plan:   1. Psoriasis - Refill methotrexate 2.5 MG tablet; TAKE 4 TABLETS BY MOUTH ONCE A WEEK  Dispense: 48 tablet; Refill: 3 - Refill folic acid (FOLVITE) 1 MG tablet; Take 1 tablet (1 mg total) by mouth daily.  Dispense: 90 tablet; Refill: 3 - Dermatology confirmed psoriasis by positive punch biopsy. No f/u has been made. Last telephone note says to  consider Consentx if incomplete resolution of psoriasis but I will recommend patient to return to dermatology for treatment with Cosentx.  2. High risk medication use - folic acid (FOLVITE) 1 MG tablet; Take 1 tablet (1 mg total) by mouth daily.  Dispense: 90 tablet; Refill: 3 - CBC with Differential - Comprehensive metabolic panel - Methotrexate level  3. Elevated lipoprotein(a) - Lipid panel  4. Chronic pain of both knees - DG Knee Complete 4 Views Left; Future - DG Knee Complete 4 Views Right; Future - Begin naproxen (NAPROSYN) 500 MG tablet; Take 1 tablet (500 mg total) by mouth 2 (two) times daily with a meal.  Dispense: 30 tablet; Refill: 0   Meds ordered this encounter  Medications  . methotrexate 2.5 MG tablet    Sig: TAKE 4 TABLETS BY MOUTH ONCE A WEEK    Dispense:  48 tablet    Refill:  3    Please consider 90 day supplies to promote better adherence    Order Specific Question:   Supervising Provider    Answer:   Hoy RegisterNEWLIN, ENOBONG [[id number]]  . DISCONTD: methotrexate (RHEUMATREX) 10 MG tablet    Sig: Take 1 tablet (10 mg total) by mouth once a week. Caution: Chemotherapy. Protect from light.    Dispense:  12 tablet    Refill:  1    Order Specific Question:   Supervising Provider    Answer:   Hoy RegisterNEWLIN, ENOBONG [[id number]]  . folic acid (FOLVITE) 1 MG tablet    Sig: Take 1 tablet (1 mg total) by mouth daily.    Dispense:  90 tablet    Refill:  3    Order Specific Question:   Supervising Provider    Answer:   Hoy RegisterNEWLIN, ENOBONG [[id number]]  . naproxen (NAPROSYN) 500 MG tablet    Sig: Take 1 tablet (500 mg total) by mouth 2 (two) times daily with a meal.    Dispense:  30 tablet    Refill:  0    Order Specific Question:   Supervising Provider    Answer:   Hoy RegisterNEWLIN, ENOBONG [[id number]]    Follow-up: Return in about 8 weeks (around 03/09/2018) for Labs for methotrexate.   Loletta Specteroger David Nakea Gouger PA

## 2018-01-19 ENCOUNTER — Ambulatory Visit (HOSPITAL_COMMUNITY)
Admission: RE | Admit: 2018-01-19 | Discharge: 2018-01-19 | Disposition: A | Discharge status: Home / Self Care | Payer: 59 | Source: Ambulatory Visit | Attending: Physician Assistant | Admitting: Physician Assistant

## 2018-01-19 DIAGNOSIS — G8929 Other chronic pain: Secondary | ICD-10-CM | POA: Insufficient documentation

## 2018-01-19 DIAGNOSIS — M25561 Pain in right knee: Secondary | ICD-10-CM

## 2018-01-19 DIAGNOSIS — M25462 Effusion, left knee: Secondary | ICD-10-CM | POA: Diagnosis not present

## 2018-01-19 DIAGNOSIS — M25562 Pain in left knee: Principal | ICD-10-CM

## 2018-01-19 DIAGNOSIS — M25461 Effusion, right knee: Secondary | ICD-10-CM | POA: Insufficient documentation

## 2018-01-22 ENCOUNTER — Telehealth (INDEPENDENT_AMBULATORY_CARE_PROVIDER_SITE_OTHER): Payer: Self-pay | Admitting: Physician Assistant

## 2018-01-22 NOTE — Telephone Encounter (Signed)
Pt came in to request a medication change -naproxen (NAPROSYN) 500 MG tablet  Is causing him stomach discomfort  Please follow up  Pt's pharmacy [facility] [address]- [place], [place] - [address]

## 2018-01-22 NOTE — Telephone Encounter (Signed)
He may take Naproxen with food. If still too upsetting, he may take OTC Aleve which is naproxen 220 mg. May take OTC up to twice per day.

## 2018-01-23 ENCOUNTER — Telehealth (INDEPENDENT_AMBULATORY_CARE_PROVIDER_SITE_OTHER): Payer: Self-pay

## 2018-01-23 NOTE — Telephone Encounter (Signed)
Harriett Sineancy [phone]) with [employer] interpre[employer] left message asking patient to call [facility]. Maryjean Mornempestt S Mildreth Reek, CMA

## 2018-01-23 NOTE — Telephone Encounter (Signed)
-----   Message from Loletta Specteroger David Gomez, PA-C sent at 01/22/2018  8:40 AM EDT ----- Right knee - Small joint effusion with mild spurring laterally. Left knee- small joint effusion. Small metal fragments seen in thigh and close to knee.

## 2018-01-23 NOTE — Telephone Encounter (Signed)
Left voicemail asking patient to call the office. If he calls with an interpreter please instruct him to take naproxen with food. If he still has an upset stomach he may take OTC aleve which is naproxen 220 mg. He may take the OTC up to twice per day. Maryjean Mornempestt S Roberts, CMA

## 2018-01-26 ENCOUNTER — Encounter (INDEPENDENT_AMBULATORY_CARE_PROVIDER_SITE_OTHER): Payer: Self-pay

## 2018-01-26 ENCOUNTER — Telehealth (INDEPENDENT_AMBULATORY_CARE_PROVIDER_SITE_OTHER): Payer: Self-pay

## 2018-01-26 NOTE — Telephone Encounter (Signed)
-----   Message from Loletta Specteroger David Gomez, PA-C sent at 01/22/2018  8:40 AM EDT ----- Right knee - Small joint effusion with mild spurring laterally. Left knee- small joint effusion. Small metal fragments seen in thigh and close to knee.

## 2018-01-26 NOTE — Telephone Encounter (Signed)
Tried calling patient with [employer]. Patients phone does not ring. Only silence. Also called without the interpreter to see if the problem was the interpreter line. When called directly from clinic the same thing happened. Does not ring, only silent. Will mail results to patient. Jared Porter, CMA

## 2018-03-09 ENCOUNTER — Ambulatory Visit (INDEPENDENT_AMBULATORY_CARE_PROVIDER_SITE_OTHER): Payer: No Typology Code available for payment source | Admitting: Physician Assistant

## 2018-03-09 DIAGNOSIS — Z0189 Encounter for other specified special examinations: Secondary | ICD-10-CM

## 2018-03-09 NOTE — Progress Notes (Signed)
Lab only visit. Labs drawn by onsite [facility] phlebotomist. Tempestt S Roberts, CMA  

## 2018-03-09 NOTE — Progress Notes (Signed)
Lab only visit 

## 2018-03-12 LAB — LIPID PANEL
CHOLESTEROL TOTAL: 201 mg/dL — AB (ref 100–199)
Chol/HDL Ratio: 3.6 ratio (ref 0.0–5.0)
HDL: 56 mg/dL (ref >39)
LDL Calculated: 122 mg/dL — ABNORMAL HIGH (ref 0–99)
TRIGLYCERIDES: 117 mg/dL (ref 0–149)
VLDL Cholesterol Cal: 23 mg/dL (ref 5–40)

## 2018-03-12 LAB — CBC WITH DIFFERENTIAL/PLATELET
BASOS: 1 %
Basophils Absolute: 0 10*3/uL (ref 0.0–0.2)
EOS (ABSOLUTE): 0.3 10*3/uL (ref 0.0–0.4)
EOS: 5 %
HEMATOCRIT: 41.9 % (ref 37.5–51.0)
Hemoglobin: 14.6 g/dL (ref 13.0–17.7)
IMMATURE GRANULOCYTES: 0 %
Immature Grans (Abs): 0 10*3/uL (ref 0.0–0.1)
LYMPHS ABS: 2.3 10*3/uL (ref 0.7–3.1)
Lymphs: 46 %
MCH: 32.2 pg (ref 26.6–33.0)
MCHC: 34.8 g/dL (ref 31.5–35.7)
MCV: 93 fL (ref 79–97)
MONOS ABS: 0.6 10*3/uL (ref 0.1–0.9)
Monocytes: 11 %
NEUTROS ABS: 1.9 10*3/uL (ref 1.4–7.0)
Neutrophils: 37 %
PLATELETS: 264 10*3/uL (ref 150–450)
RBC: 4.53 x10E6/uL (ref 4.14–5.80)
RDW: 13.3 % (ref 12.3–15.4)
WBC: 5.1 10*3/uL (ref 3.4–10.8)

## 2018-03-12 LAB — COMPREHENSIVE METABOLIC PANEL
A/G RATIO: 1.8 (ref 1.2–2.2)
ALT: 19 IU/L (ref 0–44)
AST: 20 IU/L (ref 0–40)
Albumin: 4.4 g/dL (ref 3.5–5.5)
Alkaline Phosphatase: 84 IU/L (ref 39–117)
BILIRUBIN TOTAL: 0.5 mg/dL (ref 0.0–1.2)
BUN/Creatinine Ratio: 19 (ref 9–20)
BUN: 15 mg/dL (ref 6–24)
CALCIUM: 9.7 mg/dL (ref 8.7–10.2)
CHLORIDE: 104 mmol/L (ref 96–106)
CO2: 23 mmol/L (ref 20–29)
Creatinine, Ser: 0.8 mg/dL (ref 0.76–1.27)
GFR calc Af Amer: 115 mL/min/{1.73_m2} (ref >59)
GFR, EST NON AFRICAN AMERICAN: 99 mL/min/{1.73_m2} (ref >59)
GLOBULIN, TOTAL: 2.4 g/dL (ref 1.5–4.5)
Glucose: 85 mg/dL (ref 65–99)
POTASSIUM: 3.8 mmol/L (ref 3.5–5.2)
SODIUM: 142 mmol/L (ref 134–144)
Total Protein: 6.8 g/dL (ref 6.0–8.5)

## 2018-03-12 LAB — METHOTREXATE: Methotrexate Lvl: NOT DETECTED umol/L (ref 0.02–5.00)

## 2018-03-13 ENCOUNTER — Other Ambulatory Visit (INDEPENDENT_AMBULATORY_CARE_PROVIDER_SITE_OTHER): Payer: Self-pay | Admitting: Physician Assistant

## 2018-03-13 DIAGNOSIS — E7841 Elevated Lipoprotein(a): Secondary | ICD-10-CM

## 2018-03-13 MED ORDER — PRAVASTATIN SODIUM 40 MG PO TABS: 40.0000 mg | ORAL_TABLET | Freq: Every day | ORAL | 3 refills | Status: DC

## 2018-03-16 ENCOUNTER — Telehealth (INDEPENDENT_AMBULATORY_CARE_PROVIDER_SITE_OTHER): Payer: Self-pay

## 2018-03-16 NOTE — Telephone Encounter (Signed)
-----   Message from Loletta Specteroger David Gomez, PA-C sent at 03/13/2018  8:37 AM EDT ----- All results normal except for elevated cholesterol. I have sent pravastatin to [facility] [address].

## 2018-03-16 NOTE — Telephone Encounter (Signed)
Call placed using [employer] interpreter [phone]) patient aware that labs are normal except for elevated cholesterol; pravastatin sent to [facility] [address]. Patient stated he would go pick up Rx now. Maryjean Mornempestt S Sanel Stemmer, CMA

## 2019-01-28 ENCOUNTER — Ambulatory Visit (INDEPENDENT_AMBULATORY_CARE_PROVIDER_SITE_OTHER): Payer: 59 | Admitting: Primary Care

## 2019-02-07 ENCOUNTER — Other Ambulatory Visit: Payer: Self-pay

## 2019-02-07 ENCOUNTER — Ambulatory Visit (INDEPENDENT_AMBULATORY_CARE_PROVIDER_SITE_OTHER): Payer: BC Managed Care – PPO | Admitting: Primary Care

## 2019-02-07 ENCOUNTER — Encounter (INDEPENDENT_AMBULATORY_CARE_PROVIDER_SITE_OTHER): Payer: Self-pay | Admitting: Primary Care

## 2019-02-07 VITALS — BP 129/74 | HR 79 | Temp 98.4°F | Ht 71.0 in | Wt 167.2 lb

## 2019-02-07 DIAGNOSIS — E7841 Elevated Lipoprotein(a): Secondary | ICD-10-CM | POA: Diagnosis not present

## 2019-02-07 DIAGNOSIS — L308 Other specified dermatitis: Secondary | ICD-10-CM | POA: Diagnosis not present

## 2019-02-07 NOTE — Patient Instructions (Signed)
Psoriasis Psoriasis is a long-term (chronic) skin condition. It occurs because your body's defense system (immune system) causes skin cells to form too quickly. This causes raised, red patches (plaques) on your skin that look silvery. The patches may be on all areas of your body. They can be any size or shape. Psoriasis can come and go. It can range from mild to very bad. It cannot be passed from one person to another (is not contagious). There is no cure for this condition, but it can be helped with treatment. What are the causes? The cause of psoriasis is not known. Some things can make it worse. These are:  Skin damage, such as cuts, scrapes, sunburn, and dryness.  Not getting enough sunlight.  Some medicines.  Alcohol.  Tobacco.  Stress.  Infections. What increases the risk?  Having a family member with psoriasis.  Being very overweight (obese).  Being 20-40 years old.  Taking certain medicines. What are the signs or symptoms? There are different types of psoriasis. The types are:  Plaque. This is the most common. Symptoms include red, raised patches with a silvery coating. These may be itchy. Your nails may be crumbly or fall off.  Guttate. Symptoms include small red spots on your stomach area, arms, and legs. These may happen after you have been sick, such as with strep throat.  Inverse. Symptoms include patches in your armpits, under your breasts, private areas, or on your butt.  Pustular. Symptoms include pus-filled bumps on the palms of your hands or the soles of your feet. You also may feel very tired, weak, have a fever, and not be hungry.  Erythrodermic. Symptoms include bright red skin that looks burned. You may have a fast heartbeat and a body temperature that is too high or too low. You may be itchy or in pain.  Sebopsoriasis. Symptoms include red patches on your scalp, forehead, and face that are greasy.  Psoriatic arthritis. Symptoms include swollen,  painful joints along with scaly skin patches. How is this treated? There is no cure for this condition, but treatment can:  Help your skin heal.  Lessen itching and irritation and swelling (inflammation).  Slow the growth of new skin cells.  Help your body's defense system respond better to your skin. Treatment may include:  Creams or ointments.  Light therapy. This may include natural sunlight or light therapy in a doctor's office.  Medicines. These can help your body better manage skin cells. They may be used with light therapy or ointments. Medicines may include pills or injections. You may also get antibiotic medicines if you have an infection. Follow these instructions at home: Skin Care  Apply lotion to your skin as needed. Only use those that your doctor has said are okay.  Apply cool, wet cloths (cold compresses) to the affected areas.  Do not use a hot tub or take hot showers. Use slightly warm, not hot, water when taking showers and baths.  Do not scratch your skin. Lifestyle   Do not use any products that contain nicotine or tobacco, such as cigarettes, e-cigarettes, and chewing tobacco. If you need help quitting, ask your doctor.  Lower your stress.  Keep a healthy weight.  Go out in the sun as told by your doctor. Do not get sunburned.  Join a support group. Medicines  Take or use over-the-counter and prescription medicines only as told by your doctor.  If you were prescribed an antibiotic medicine, take it as told by your doctor.   Do not stop using the antibiotic even if you start to feel better. Alcohol use If you drink alcohol:  Limit how much you use: ? 0-1 drink a day for women. ? 0-2 drinks a day for men.  Be aware of how much alcohol is in your drink. In the [place] one drink equals one 12 oz bottle of beer (355 mL), one 5 oz glass of wine (148 mL), or one 1 oz glass of hard liquor (44 mL). General instructions  Keep a journal to track the  things that cause symptoms (triggers). Try to avoid these things.  See a counselor if you feel the support would help.  Keep all follow-up visits as told by your doctor. This is important. Contact a doctor if:  You have a fever.  Your pain gets worse.  You have more redness or warmth in the affected areas.  You have new or worse pain or stiffness in your joints.  Your nails start to break easily or pull away from the nail bed.  You feel very sad (depressed). Summary  Psoriasis is a long-term (chronic) skin condition.  There is no cure for this condition, but treatment can help manage it.  Keep a journal to track the things that cause symptoms.  Take or use over-the-counter and prescription medicines only as told by your doctor.  Keep all follow-up visits as told by your doctor. This is important. This information is not intended to replace advice given to you by your health care provider. Make sure you discuss any questions you have with your health care provider. Document Released: 09/01/2004 Document Revised: 05/29/2018 Document Reviewed: 05/29/2018 Elsevier Patient Education  2020 Elsevier Inc.  

## 2019-02-07 NOTE — Progress Notes (Signed)
Established Patient Office Visit  Subjective:  Patient ID: Jared Porter, male    DOB: 1961-07-04  Age: 58 y.o. MRN: [id number]  CC:  Chief Complaint  Patient presents with  . Referral    needs refill of methotrexate     HPI Jared Porter presents for management psoriasis.  and hyperlipidemia .He has been referred to dermatology. Previously prescribed methotrexate and folic acid this regiment will need to be deterrmine by dermatologist at the appointment. He has not been seen since 01/12/2018 will order labs.  History reviewed. No pertinent past medical history.  History reviewed. No pertinent surgical history.  History reviewed. No pertinent family history.  Social History   Socioeconomic History  . Marital status: Single    Spouse name: Not on file  . Number of children: Not on file  . Years of education: Not on file  . Highest education level: Not on file  Occupational History  . Not on file  Social Needs  . Financial resource strain: Not on file  . Food insecurity    Worry: Not on file    Inability: Not on file  . Transportation needs    Medical: Not on file    Non-medical: Not on file  Tobacco Use  . Smoking status: Current Every Day Smoker  . Smokeless tobacco: Never Used  Substance and Sexual Activity  . Alcohol use: Not on file  . Drug use: Not on file  . Sexual activity: Not on file  Lifestyle  . Physical activity    Days per week: Not on file    Minutes per session: Not on file  . Stress: Not on file  Relationships  . Social Herbalist on phone: Not on file    Gets together: Not on file    Attends religious service: Not on file    Active member of club or organization: Not on file    Attends meetings of clubs or organizations: Not on file    Relationship status: Not on file  . Intimate partner violence    Fear of current or ex partner: Not on file    Emotionally abused: Not on file    Physically abused: Not on  file    Forced sexual activity: Not on file  Other Topics Concern  . Not on file  Social History Narrative  . Not on file    Outpatient Medications Prior to Visit  Medication Sig Dispense Refill  . methotrexate 2.5 MG tablet TAKE 4 TABLETS BY MOUTH ONCE A WEEK (Patient not taking: Reported on 02/07/2019) 48 tablet 3  . pravastatin (PRAVACHOL) 40 MG tablet Take 1 tablet (40 mg total) by mouth daily. (Patient not taking: Reported on 02/07/2019) 90 tablet 3  . folic acid (FOLVITE) 1 MG tablet Take 1 tablet (1 mg total) by mouth daily. 90 tablet 3  . naproxen (NAPROSYN) 500 MG tablet Take 1 tablet (500 mg total) by mouth 2 (two) times daily with a meal. 30 tablet 0   No facility-administered medications prior to visit.     No Known Allergies  ROS Review of Systems  Skin: Positive for color change and rash.  All other systems reviewed and are negative.     Objective:    Physical Exam  Constitutional: He is oriented to person, place, and time. He appears well-developed and well-nourished.  HENT:  Head: Normocephalic.  Eyes: EOM are normal.  Neck: Normal range of motion.  Cardiovascular: Normal  rate.  Pulmonary/Chest: Effort normal and breath sounds normal.  Abdominal: Bowel sounds are normal.  Musculoskeletal: Normal range of motion.  Neurological: He is oriented to person, place, and time.  Skin: Rash noted.  Psychiatric: He has a normal mood and affect.    BP 129/74 (BP Location: Right Arm, Patient Position: Sitting, Cuff Size: Normal)   Pulse 79   Temp 98.4 F (36.9 C) (Oral)   Ht 5' 11"  (1.803 m)   Wt 167 lb 3.2 oz (75.8 kg)   SpO2 100%   BMI 23.32 kg/m  Wt Readings from Last 3 Encounters:  02/07/19 167 lb 3.2 oz (75.8 kg)  01/12/18 187 lb (84.8 kg)  09/14/17 177 lb 3.2 oz (80.4 kg)     Health Maintenance Due  Topic Date Due  . TETANUS/TDAP  08/09/1979  . COLONOSCOPY  08/08/2010    There are no preventive care reminders to display for this patient.  No  results found for: TSH Lab Results  Component Value Date   WBC 5.1 03/09/2018   HGB 14.6 03/09/2018   HCT 41.9 03/09/2018   MCV 93 03/09/2018   PLT 264 03/09/2018   Lab Results  Component Value Date   NA 145 (H) 02/07/2019   K 3.6 02/07/2019   CO2 23 02/07/2019   GLUCOSE 109 (H) 02/07/2019   BUN 10 02/07/2019   CREATININE 1.01 02/07/2019   BILITOT 0.3 02/07/2019   ALKPHOS 79 02/07/2019   AST 24 02/07/2019   ALT 26 02/07/2019   PROT 6.8 02/07/2019   ALBUMIN 4.3 02/07/2019   CALCIUM 9.4 02/07/2019   Lab Results  Component Value Date   CHOL 184 02/07/2019   Lab Results  Component Value Date   HDL 62 02/07/2019   Lab Results  Component Value Date   LDLCALC 110 (H) 02/07/2019   Lab Results  Component Value Date   TRIG 60 02/07/2019   Lab Results  Component Value Date   CHOLHDL 3.0 02/07/2019   Lab Results  Component Value Date   HGBA1C 5.6 05/08/2017      Assessment & Plan:   Problem List Items Addressed This Visit    None    Visit Diagnoses    Psoriasiform dermatitis    -  Primary   Relevant Orders   CMP14+EGFR (Completed)   Ambulatory referral to Dermatology   Elevated lipoprotein(a)       Relevant Orders   Lipid Panel (Completed)      No orders of the defined types were placed in this encounter. Jared Porter was seen today for referral.  Diagnoses and all orders for this visit:  Psoriasiform dermatitis -     CMP14+EGFR -     Ambulatory referral to Dermatology  Elevated lipoprotein(a)  Healthy lifestyle diet of fruits vegetables fish nuts whole grains and low saturated fat . Foods high in cholesterol or liver, fatty meats,cheese, butter avocados, nuts and seeds, chocolate and fried foods. -     Lipid Panel    Follow-up: Return in about 3 months (around 05/10/2019) for hyperlipidermia .    Kerin Perna, NP

## 2019-02-08 LAB — CMP14+EGFR
ALT: 26 IU/L (ref 0–44)
AST: 24 IU/L (ref 0–40)
Albumin/Globulin Ratio: 1.7 (ref 1.2–2.2)
Albumin: 4.3 g/dL (ref 3.8–4.9)
Alkaline Phosphatase: 79 IU/L (ref 39–117)
BUN/Creatinine Ratio: 10 (ref 9–20)
BUN: 10 mg/dL (ref 6–24)
Bilirubin Total: 0.3 mg/dL (ref 0.0–1.2)
CO2: 23 mmol/L (ref 20–29)
Calcium: 9.4 mg/dL (ref 8.7–10.2)
Chloride: 104 mmol/L (ref 96–106)
Creatinine, Ser: 1.01 mg/dL (ref 0.76–1.27)
GFR calc Af Amer: 94 mL/min/{1.73_m2} (ref >59)
GFR calc non Af Amer: 82 mL/min/{1.73_m2} (ref >59)
Globulin, Total: 2.5 g/dL (ref 1.5–4.5)
Glucose: 109 mg/dL — ABNORMAL HIGH (ref 65–99)
Potassium: 3.6 mmol/L (ref 3.5–5.2)
Sodium: 145 mmol/L — ABNORMAL HIGH (ref 134–144)
Total Protein: 6.8 g/dL (ref 6.0–8.5)

## 2019-02-08 LAB — LIPID PANEL
Chol/HDL Ratio: 3 ratio (ref 0.0–5.0)
Cholesterol, Total: 184 mg/dL (ref 100–199)
HDL: 62 mg/dL (ref >39)
LDL Calculated: 110 mg/dL — ABNORMAL HIGH (ref 0–99)
Triglycerides: 60 mg/dL (ref 0–149)
VLDL Cholesterol Cal: 12 mg/dL (ref 5–40)

## 2019-02-15 ENCOUNTER — Telehealth (INDEPENDENT_AMBULATORY_CARE_PROVIDER_SITE_OTHER): Payer: Self-pay

## 2019-02-15 NOTE — Telephone Encounter (Signed)
-----   Message from Kerin Perna, NP sent at 02/09/2019  6:22 PM EDT ----- Labs are good except  LDL calculation no medication needed decrease fried foods food high in fats

## 2019-02-15 NOTE — Telephone Encounter (Signed)
Call placed using pacific interpreter [phone]) patient is aware that labs are good. No medication needed. Advised to decrease the amount of fried foods and foods high in fat. Patient expressed understanding. Nat Christen, CMA

## 2019-05-15 ENCOUNTER — Other Ambulatory Visit (INDEPENDENT_AMBULATORY_CARE_PROVIDER_SITE_OTHER): Payer: 59

## 2019-05-16 ENCOUNTER — Other Ambulatory Visit (INDEPENDENT_AMBULATORY_CARE_PROVIDER_SITE_OTHER): Payer: BC Managed Care – PPO

## 2019-05-16 ENCOUNTER — Other Ambulatory Visit (INDEPENDENT_AMBULATORY_CARE_PROVIDER_SITE_OTHER): Payer: Self-pay | Admitting: Primary Care

## 2019-05-16 ENCOUNTER — Ambulatory Visit (INDEPENDENT_AMBULATORY_CARE_PROVIDER_SITE_OTHER): Payer: 59 | Admitting: Primary Care

## 2019-05-16 ENCOUNTER — Other Ambulatory Visit: Payer: Self-pay

## 2019-05-16 DIAGNOSIS — L308 Other specified dermatitis: Secondary | ICD-10-CM

## 2019-05-16 DIAGNOSIS — Z79899 Other long term (current) drug therapy: Secondary | ICD-10-CM

## 2019-05-16 DIAGNOSIS — E7841 Elevated Lipoprotein(a): Secondary | ICD-10-CM

## 2019-05-17 ENCOUNTER — Telehealth (INDEPENDENT_AMBULATORY_CARE_PROVIDER_SITE_OTHER): Payer: Self-pay

## 2019-05-17 LAB — CBC WITH DIFFERENTIAL/PLATELET
Basophils Absolute: 0.1 10*3/uL (ref 0.0–0.2)
Basos: 1 %
EOS (ABSOLUTE): 0.4 10*3/uL (ref 0.0–0.4)
Eos: 7 %
Hematocrit: 44.5 % (ref 37.5–51.0)
Hemoglobin: 14.7 g/dL (ref 13.0–17.7)
Immature Grans (Abs): 0 10*3/uL (ref 0.0–0.1)
Immature Granulocytes: 0 %
Lymphocytes Absolute: 2.5 10*3/uL (ref 0.7–3.1)
Lymphs: 47 %
MCH: 30.8 pg (ref 26.6–33.0)
MCHC: 33 g/dL (ref 31.5–35.7)
MCV: 93 fL (ref 79–97)
Monocytes Absolute: 0.6 10*3/uL (ref 0.1–0.9)
Monocytes: 12 %
Neutrophils Absolute: 1.8 10*3/uL (ref 1.4–7.0)
Neutrophils: 33 %
Platelets: 248 10*3/uL (ref 150–450)
RBC: 4.78 x10E6/uL (ref 4.14–5.80)
RDW: 12.2 % (ref 11.6–15.4)
WBC: 5.3 10*3/uL (ref 3.4–10.8)

## 2019-05-17 LAB — CMP14+EGFR
ALT: 35 IU/L (ref 0–44)
AST: 30 IU/L (ref 0–40)
Albumin/Globulin Ratio: 1.6 (ref 1.2–2.2)
Albumin: 3.9 g/dL (ref 3.8–4.9)
Alkaline Phosphatase: 85 IU/L (ref 39–117)
BUN/Creatinine Ratio: 17 (ref 9–20)
BUN: 13 mg/dL (ref 6–24)
Bilirubin Total: 0.3 mg/dL (ref 0.0–1.2)
CO2: 25 mmol/L (ref 20–29)
Calcium: 9.3 mg/dL (ref 8.7–10.2)
Chloride: 106 mmol/L (ref 96–106)
Creatinine, Ser: 0.78 mg/dL (ref 0.76–1.27)
GFR calc Af Amer: 115 mL/min/{1.73_m2} (ref >59)
GFR calc non Af Amer: 99 mL/min/{1.73_m2} (ref >59)
Globulin, Total: 2.5 g/dL (ref 1.5–4.5)
Glucose: 88 mg/dL (ref 65–99)
Potassium: 3.8 mmol/L (ref 3.5–5.2)
Sodium: 141 mmol/L (ref 134–144)
Total Protein: 6.4 g/dL (ref 6.0–8.5)

## 2019-05-17 LAB — LIPID PANEL
Chol/HDL Ratio: 3 ratio (ref 0.0–5.0)
Cholesterol, Total: 191 mg/dL (ref 100–199)
HDL: 63 mg/dL (ref >39)
LDL Chol Calc (NIH): 111 mg/dL — ABNORMAL HIGH (ref 0–99)
Triglycerides: 92 mg/dL (ref 0–149)
VLDL Cholesterol Cal: 17 mg/dL (ref 5–40)

## 2019-05-17 NOTE — Telephone Encounter (Signed)
-----   Message from Michelle P Edwards, NP sent at 05/17/2019  9:12 AM EDT ----- Regarding: lab Reviewed labs labs were normal except for cholesterol. He needs to continue pravastatin and decrease  fatty foods, red meat, cheese, milk and increase fiber like whole grains and veggies.    

## 2019-05-17 NOTE — Telephone Encounter (Signed)
Call placed using pacific interpreter Amal([employer]) patient did not answer and voicemail is not setup yet. Nat Christen, CMA

## 2019-05-24 ENCOUNTER — Encounter (INDEPENDENT_AMBULATORY_CARE_PROVIDER_SITE_OTHER): Payer: Self-pay

## 2019-05-24 ENCOUNTER — Telehealth (INDEPENDENT_AMBULATORY_CARE_PROVIDER_SITE_OTHER): Payer: Self-pay

## 2019-05-24 NOTE — Telephone Encounter (Signed)
Called patient using pacific interpreter [phone]) patient voicemail not set up. Will mail unable to reach letter to patient. Nat Christen, CMA

## 2019-05-24 NOTE — Telephone Encounter (Signed)
-----   Message from Kerin Perna, NP sent at 05/17/2019  9:12 AM EDT ----- Regarding: lab Reviewed labs labs were normal except for cholesterol. He needs to continue pravastatin and decrease  fatty foods, red meat, cheese, milk and increase fiber like whole grains and veggies.

## 2019-06-17 ENCOUNTER — Telehealth (INDEPENDENT_AMBULATORY_CARE_PROVIDER_SITE_OTHER): Payer: Self-pay

## 2019-06-17 NOTE — Telephone Encounter (Signed)
Patient came into clinic in regards to letter that he got in the mail for his lab results. Patient also wanted to inform PCP that he went to the pharmacy to pick his medication but was advice that he did not have a RX for Pravastatin.   Patient will like for PCP to send the RX to [place] on [address].  Please advice [phone]  Thank you Whitney Post

## 2019-06-17 NOTE — Telephone Encounter (Signed)
Did not order pravastatin until labs were reviewed sent in

## 2019-06-17 NOTE — Telephone Encounter (Signed)
FWD to PCP. Jailen Lung S [place], CMA  

## 2019-06-18 ENCOUNTER — Other Ambulatory Visit (INDEPENDENT_AMBULATORY_CARE_PROVIDER_SITE_OTHER): Payer: Self-pay | Admitting: Primary Care

## 2019-06-18 DIAGNOSIS — E7841 Elevated Lipoprotein(a): Secondary | ICD-10-CM

## 2019-06-18 DIAGNOSIS — L409 Psoriasis, unspecified: Secondary | ICD-10-CM

## 2019-06-18 MED ORDER — PRAVASTATIN SODIUM 40 MG PO TABS: 40.0000 mg | ORAL_TABLET | Freq: Every day | ORAL | 3 refills | Status: DC

## 2019-06-18 NOTE — Telephone Encounter (Signed)
Pravastatin has not been ordered for patient since 03/2018. If patient needs this medication please send to pharmacy. Nat Christen, CMA

## 2019-06-18 NOTE — Telephone Encounter (Signed)
Sent in pravastatin.

## 2019-11-12 IMAGING — DX DG KNEE COMPLETE 4+V*L*
4 series · 4 of 4 positions shown · non-contrast
Comparison: None.

CLINICAL DATA: Chronic bilateral knee pain

EXAM:
LEFT KNEE - COMPLETE 4+ VIEW

[knee ap]
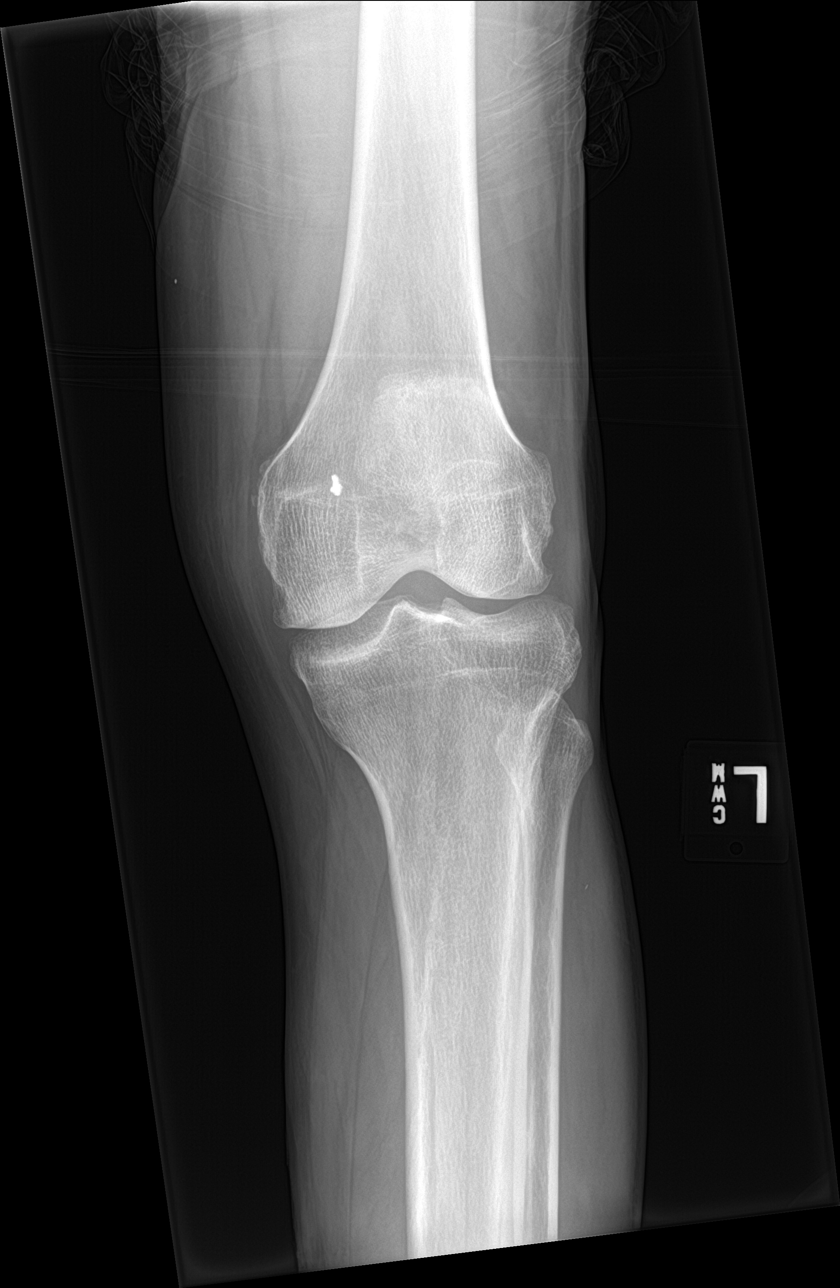

[knee lat]
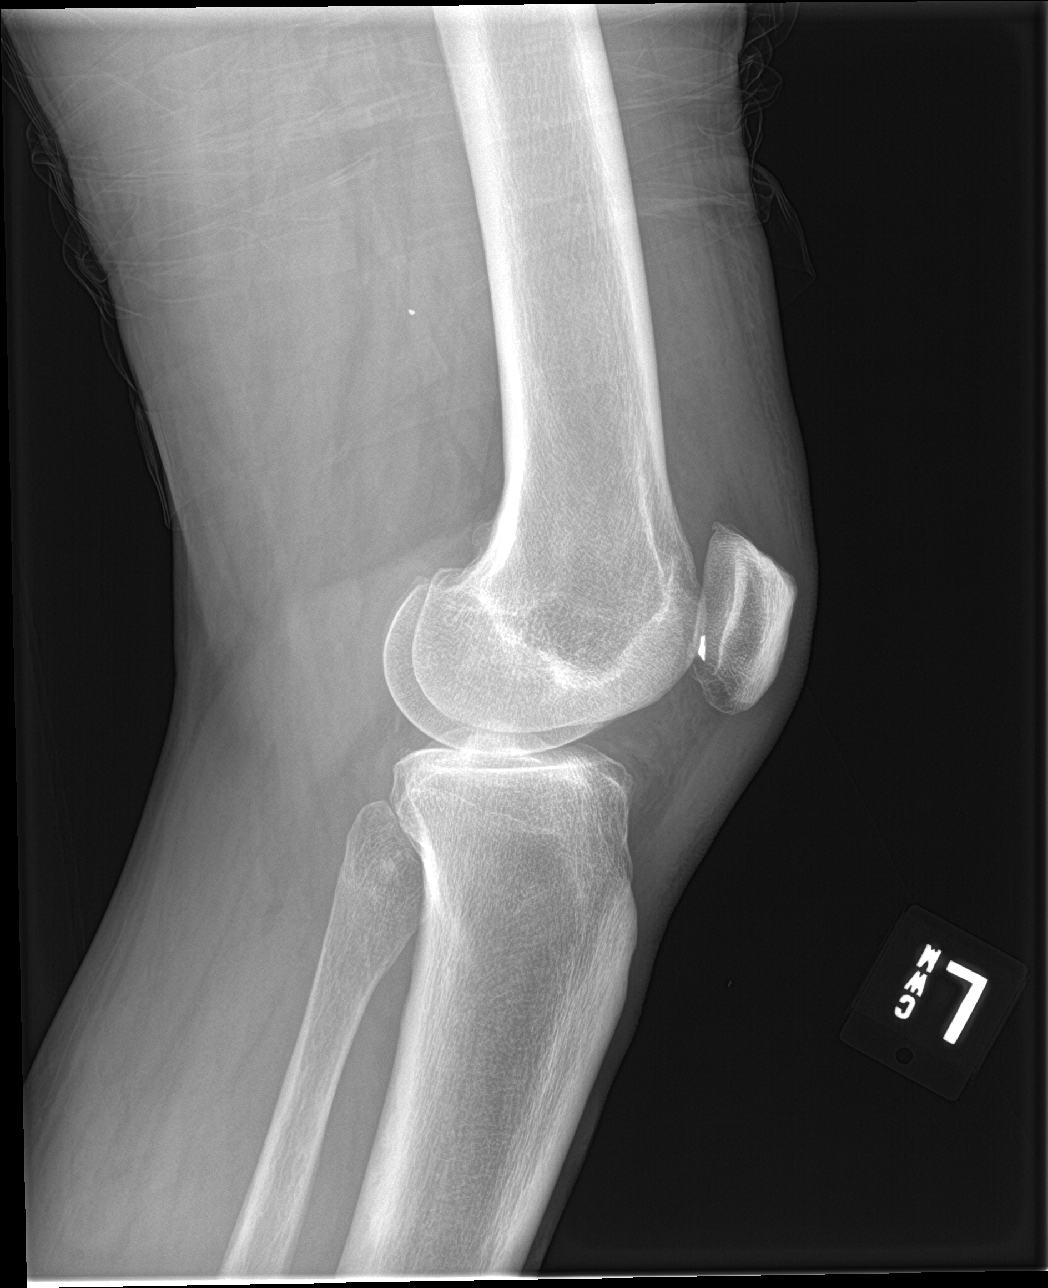

[knee obl (1 of 2)]
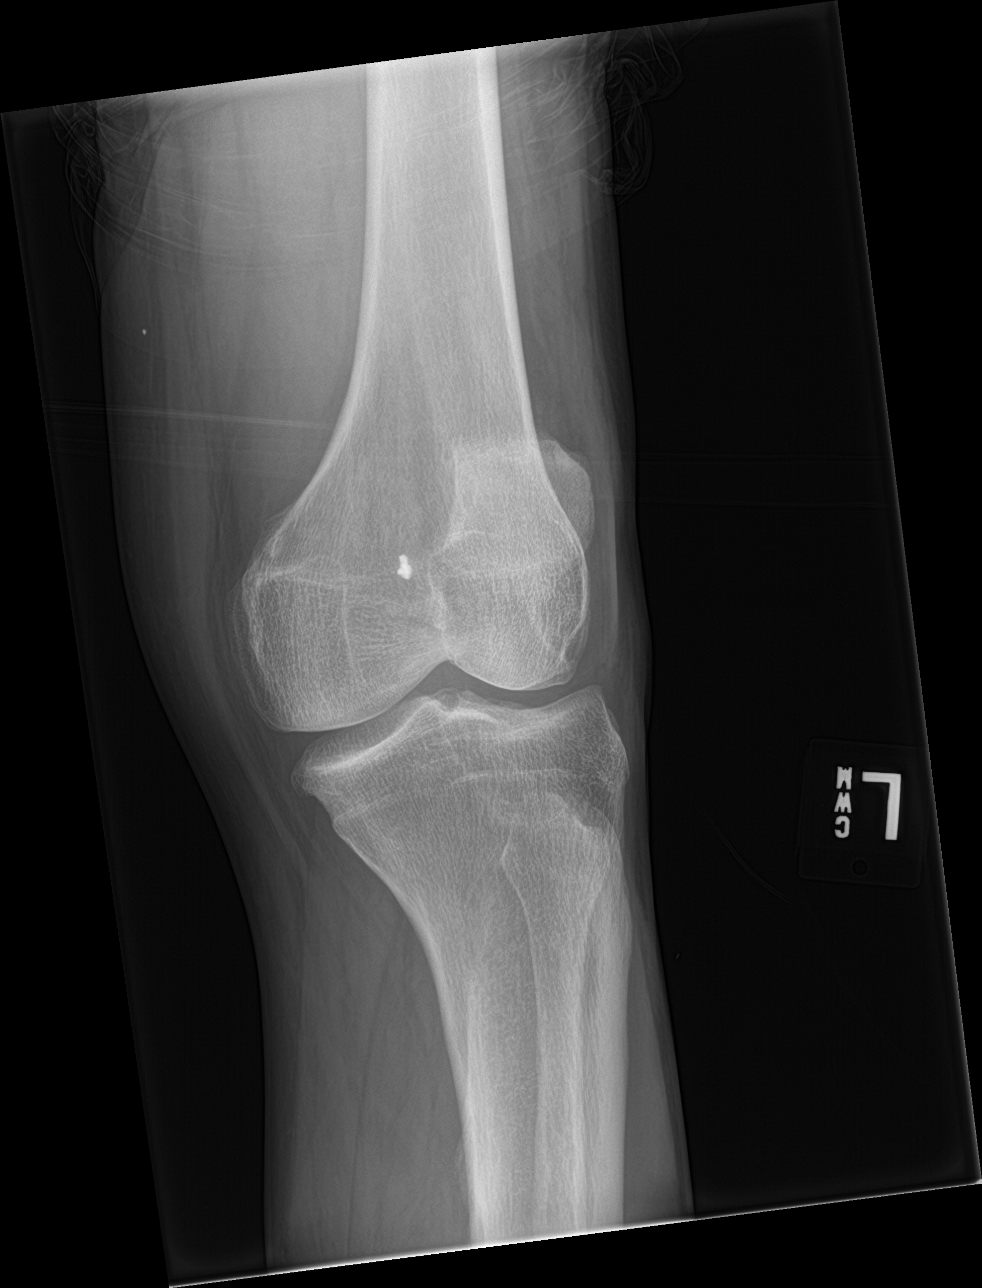

[knee obl (2 of 2)]
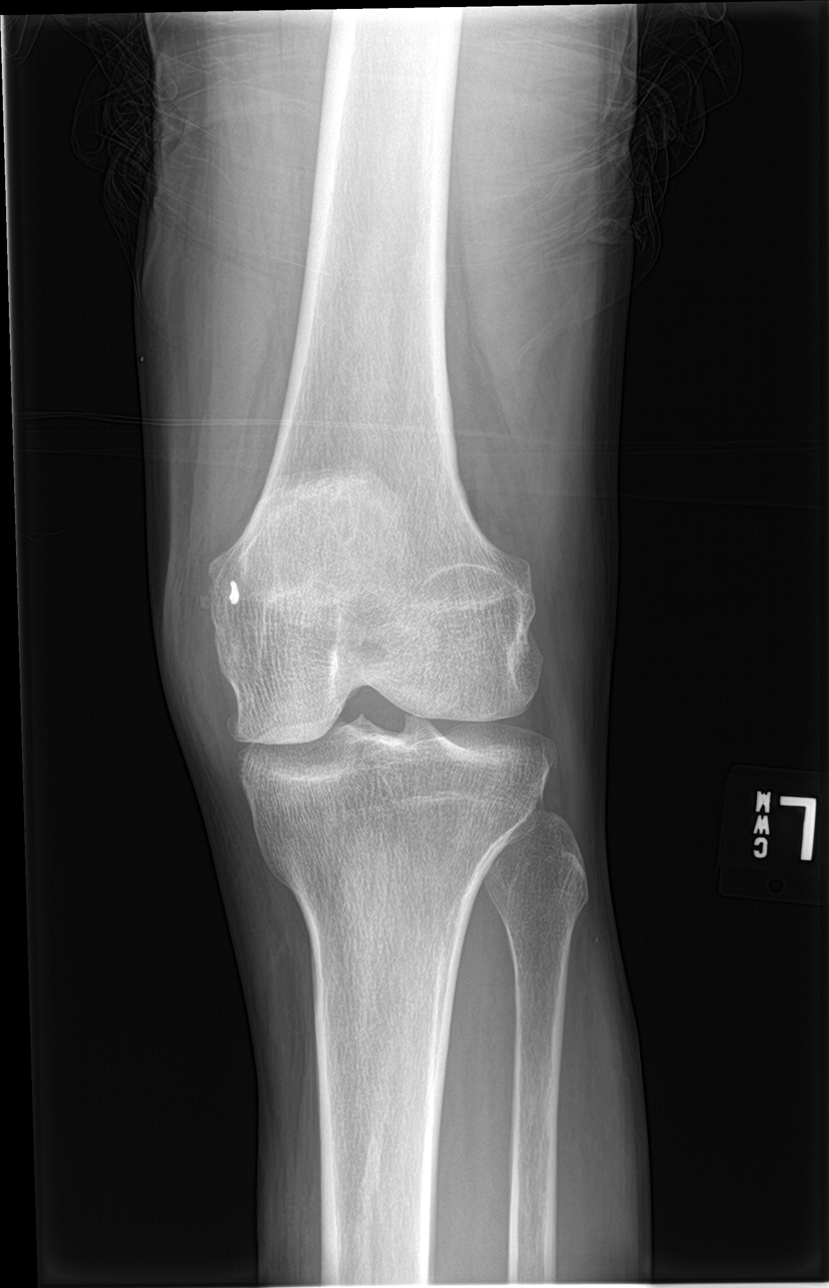

[4 of 4 positions shown; findings below may reference images not displayed]

FINDINGS: Negative for fracture. Small joint effusion. Joint spaces
maintained. 6 mm metal foreign body in the soft tissues medial to
the patella. 2 mm metal foreign body in the distal medial thigh.
IMPRESSION: Small joint effusion.  No significant degenerative change.

## 2021-02-17 ENCOUNTER — Ambulatory Visit (INDEPENDENT_AMBULATORY_CARE_PROVIDER_SITE_OTHER): Payer: 59 | Admitting: Primary Care

## 2021-02-22 ENCOUNTER — Other Ambulatory Visit: Payer: Self-pay

## 2021-02-22 ENCOUNTER — Ambulatory Visit (INDEPENDENT_AMBULATORY_CARE_PROVIDER_SITE_OTHER): Payer: Self-pay | Admitting: Primary Care

## 2021-02-22 ENCOUNTER — Encounter (INDEPENDENT_AMBULATORY_CARE_PROVIDER_SITE_OTHER): Payer: Self-pay | Admitting: Primary Care

## 2021-02-22 VITALS — BP 126/82 | HR 74 | Temp 97.5°F | Ht 71.0 in | Wt 193.2 lb

## 2021-02-22 DIAGNOSIS — M25562 Pain in left knee: Secondary | ICD-10-CM | POA: Diagnosis not present

## 2021-02-22 DIAGNOSIS — M25561 Pain in right knee: Secondary | ICD-10-CM | POA: Diagnosis not present

## 2021-02-22 DIAGNOSIS — L308 Other specified dermatitis: Secondary | ICD-10-CM | POA: Diagnosis not present

## 2021-02-22 DIAGNOSIS — E7841 Elevated Lipoprotein(a): Secondary | ICD-10-CM

## 2021-02-22 DIAGNOSIS — Z1211 Encounter for screening for malignant neoplasm of colon: Secondary | ICD-10-CM

## 2021-02-22 MED ORDER — PIMECROLIMUS 1 % EX CREA: TOPICAL_CREAM | Freq: Two times a day (BID) | CUTANEOUS | 1 refills | Status: DC

## 2021-02-22 MED ORDER — ANTHRALIN 1 % EX CREA: TOPICAL_CREAM | Freq: Every day | CUTANEOUS | 0 refills | Status: DC

## 2021-02-22 NOTE — Patient Instructions (Signed)
Anthralin topical cream ?? ??? ??????? ?????? ???????? ??? ????? ?? ???? ????? ????? ???????. ???? ??????? ??? ?????? ?????? ????? ???? ???? ??????? ?????? ?? ??????? ??????? ???? ?????. ????? ???????? ???????? ????????:? Dritho-Creme HP, Dritho-Scalp, Micanol,Psoriatec, ZITHRANOL-RR ?? ?? ??????? ???? ??? ?? ???? ??? ???? ??????? ?????? ??? ????? ??? ??????? ?? ????? ??? ????? ?? ??? ??? ?????? ??? ?? ??? ??????? ????: ????? ??????? ?? ??? ??? ???? ?? ?????? ?? ???????? ?? ??? ??? ???? ?? ?????? ?? ????? ???? ?? ??? ??? ???? ?? ?????? ?? ??????? ?? ??????? ?? ?????? ??????? ???? ?? ????? ????? ??????? ???????? ??? ??? ???? ??????? ??? ??????? ??? ?????? ????????? ??????? ???. ?? ??????? ?? ???? ????. ?????? ????????? ???????? ??? ?????? ?? ???? ?????? ??????. ????? ?????? ?????? ?????? ?????? ???? ?? ?????? ???? ?????????. ???? ???? ?????? ?? ?????. ?? ?????? ???? ???????? ????????. ??????? ?????: ?? ???? ???? ?????? ?????? ??????? ???????. ???? ???????? ????. ???? ??????? ?????? ??? ??????. ???? ???? ????? ??? ?????????. ???? ??????? ???????? ??? ??????? ????? ?? ?????? ??????? ??????. ???? ????? ???? ????. ???????? ???????? ??????. ??????? ????? ?????: ?? ???? ?????? ???? ?????? ?????? ????? ????? ????? ?????? ?? ?? ??? ?????? ??? ??????? ???????. ???? ??????? ?????? ??? ??????. ???? ??????? ???????? ??? ??????? ????? ?? ?????? ??????? ??????. ???? ????? ????????. ?????? ????? ????? ????? ?????. ???? ??? ???? ??????? ???? ??????? ??? ?????? ???????. ?? ???? ???? ???? ???????? ????. ?????? ??????? : ??? ?????? ??? ?????? ???? ????? ???? ?? ??? ??????? ????????? ?????? ?? ?????? ?? ???? ??????? ?????. ??????: ?????? ??? ?????? ?? ???? ??? ???. ?? ????? ????? ??? ?? ????? ?????????. ???? ?? ???? (????) ????? ??? ???? ????? ???????? ?? ???? ??? ????. ??? ??? ??? ??? ????? ??????? ???????? ??????? ??? ?????? ???. ?? ?????? ???? ?????? ?? ????? ?????. ?? ?? ??????? ???? ?? ?????? ?? ???  ??????? ????????? ???????? ??? ??????. ?? ?????? ?? ?????? ???? ????? ??? ?????????????? ??? ??????? ????? ?? ?????? ??????? ??????. ??? ??????? ?? ?? ??? ?? ????????? ????????. ?? ?????? ???? ??????? ?????? ????? ??? ??????? ?? ??????? ?? ??????? ???? ???? ?? ???????? ???????? ???? ????????. ?????? ????? ??? ??? ???? ?? ???? ?????? ?? ?????? ?????? ??????????. ?? ?????? ??? ??????? ?? ?????. ?? ???? ??? ???? ????? ??? ??????? ??? ??????? ???? ????? ?? ?????? ??????? ?????? ??? ?? ???? ?????? ?? ?????? ?? ??? ?????? ?????. ??? ???? ???? ????? ?? ??? ?????? ??????? ??? ????? ??? ??????? ?????????? ??? ?????? ?? ???? ?? ??????? ?????? ????? ?????? ?? ?????? ??????? ??????. ??? ?????? ?? ???? ?????????. ?? ????? ??? ???? ???? ???? ??? ???? ?????? ??? ?????? ??????? ?? ???????. ???? ???? ????? ?? ????? ???? ?????? ???? ???? ???????????? ?? ?????? ???? ????? ?????? ?? ???? ?? ??? ????? ?? ????. ??? ?????? ???? ?? ????? ???? ?????? ????? ?????. ??? ?? ????? ?? ???? ?????? ????? ?????? ????? ????? ????? ??????? ???? ??????? ?? ?????. ?? ?????????????? ??????? ?? ??????? ??????? ?? ?????? ?????. ?? ?? ?????? ???????? ???? ???? ?? ??????? ??? ???? ??? ??????? ?????? ???????? ???? ??? ?? ???? ????? ?? ?????? ??????? ?????? ??? ?? ???? ???????: ???????? ??? ????? ?????? ?????? ??????????? (?????) ???? ????? ?? ?????? ?? ??????. ??? ?? ???? ????? ?????? ?????? ???? ???? ?????? ?????? ???????? ???? ?? ????? ????? ???? (???? ????? ?? ?????? ??????? ????????? ?????? ?? ???? ????? ?????): ???? ??? ????? ?? ??????? ?? ????? ???? ???? ?????? ??? ??????? ?? ?? ??? ?? ?????? ???????? ????????. ???? ?????? ????????? ?????? ?? ?????? ????????. ????? ??????? ?? ?????? ???????? ?????? ??????? ????????????????? (  FDA) ??? ????? [phone]??? ??? ??? ???? ???????? ??????? ???? ?????? ?? ?????? ???????. ???? ?? ???? ????? ?????? ??? 15 ?30 ???? ????? (59 ?86 ????????). ???? ?? ?????? ??? ?????? ??? ????? ??????  ???????? ??????? ??? ?????? ?? ??????. ??????: ??? ??????? ????? ?? ????: ?? ?? ???? ??? ???????? ?? ????????? ???????. ??? ???? ???? ?? ????? ?? ??? ??????? ????? ??? ?????? ?? ??????? ?????? ??????? ??????.  2022 Elsevier/Gold Standard (2016-08-25 00:00:00)

## 2021-02-22 NOTE — Progress Notes (Signed)
Established Patient Office Visit  Subjective:  Patient ID: Jared Porter, male    DOB: 13-Sep-1960  Age: 60 y.o. MRN: [id number]  CC:  Chief Complaint  Patient presents with   Hyperlipidemia   Medication Refill    HPI Mr.Jared Porter is a 60 year old male who speaks Arabic (interpreter Mordecai Rasmussen [phone] )presents for evaluation of hyperlipidemia and requested a refill on his methotrexate.  Explained to patient unable to prescribe this needs to be done by a specialist such as a rheumatologist and would be glad to refer him to them.  No past medical history on file.  No past surgical history on file.  No family history on file.  Social History   Socioeconomic History   Marital status: Single    Spouse name: Not on file   Number of children: Not on file   Years of education: Not on file   Highest education level: Not on file  Occupational History   Not on file  Tobacco Use   Smoking status: Every Day   Smokeless tobacco: Never  Vaping Use   Vaping Use: Never used  Substance and Sexual Activity   Alcohol use: Not on file   Drug use: Not on file   Sexual activity: Not on file  Other Topics Concern   Not on file  Social History Narrative   Not on file   Social Determinants of Health   Financial Resource Strain: Not on file  Food Insecurity: Not on file  Transportation Needs: Not on file  Physical Activity: Not on file  Stress: Not on file  Social Connections: Not on file  Intimate Partner Violence: Not on file    Outpatient Medications Prior to Visit  Medication Sig Dispense Refill   methotrexate 2.5 MG tablet TAKE 4 TABLETS BY MOUTH ONCE A WEEK (Patient not taking: No sig reported) 48 tablet 3   pravastatin (PRAVACHOL) 40 MG tablet Take 1 tablet (40 mg total) by mouth daily. 90 tablet 3   No facility-administered medications prior to visit.    No Known Allergies  ROS Review of Systems  All other systems reviewed and are negative.     Objective:   Wt Readings from Last 3 Encounters:  02/22/21 193 lb 3.2 oz (87.6 kg)  02/07/19 167 lb 3.2 oz (75.8 kg)  01/12/18 187 lb (84.8 kg)   Vitals:   02/22/21 1036  BP: 126/82  Pulse: 74  Temp: (!) 97.5 F (36.4 C)  TempSrc: Temporal  SpO2: 94%  Weight: 193 lb 3.2 oz (87.6 kg)  Height: 5' 11"  (1.803 m)   General: Vital signs reviewed.  Patient is well-developed and well-nourished,male (thin frame)  in no acute distress and cooperative with exam.  Head: Normocephalic and atraumatic. Eyes: EOMI, conjunctivae normal, no scleral icterus.  Neck: Supple, trachea midline, normal ROM, no JVD, masses, thyromegaly, or carotid bruit present.  Cardiovascular: RRR, S1 normal, S2 normal, no murmurs, gallops, or rubs. Pulmonary/Chest: Clear to auscultation bilaterally, no wheezes, rales, or rhonchi. Abdominal: Soft, non-tender, non-distended, BS +, no masses, organomegaly, or guarding present.  Musculoskeletal: No joint deformities, erythema, or stiffness, ROM full and nontender. Extremities: No lower extremity edema bilaterally,  pulses symmetric and intact bilaterally. No cyanosis or clubbing. Neurological: A&O x3, Strength is normal and symmetric bilaterally, no focal motor deficit, sensory intact to light touch bilaterally.  Skin: Warm, dry and intact. No rashes or erythema. Psychiatric: Normal mood and affect. speech and behavior is normal. Cognition and memory are normal.  Health Maintenance Due  Topic Date Due   COVID-19 Vaccine (1) Never done   COLONOSCOPY (Pts 45-2yr Insurance coverage will need to be confirmed)  Never done    There are no preventive care reminders to display for this patient.  No results found for: TSH Lab Results  Component Value Date   WBC 5.3 05/16/2019   HGB 14.7 05/16/2019   HCT 44.5 05/16/2019   MCV 93 05/16/2019   PLT 248 05/16/2019   Lab Results  Component Value Date   NA 142 02/22/2021   K 4.2 02/22/2021   CO2 20 02/22/2021    GLUCOSE 79 02/22/2021   BUN 11 02/22/2021   CREATININE 0.80 02/22/2021   BILITOT <0.2 02/22/2021   ALKPHOS 93 02/22/2021   AST 18 02/22/2021   ALT 21 02/22/2021   PROT 6.7 02/22/2021   ALBUMIN 4.4 02/22/2021   CALCIUM 9.4 02/22/2021   EGFR 101 02/22/2021   Lab Results  Component Value Date   CHOL 206 (H) 02/22/2021   Lab Results  Component Value Date   HDL 60 02/22/2021   Lab Results  Component Value Date   LDLCALC 127 (H) 02/22/2021   Lab Results  Component Value Date   TRIG 105 02/22/2021   Lab Results  Component Value Date   CHOLHDL 3.4 02/22/2021   Lab Results  Component Value Date   HGBA1C 5.6 05/08/2017      Assessment & Plan:   Increases when trying to pray  Raunel was seen today for hyperlipidemia and medication refill.  Diagnoses and all orders for this visit:  Elevated lipoprotein(a) Your cholesterol is still high, but continued recommendations to make lifestyle changes. Your LDL is above normal.  The LDL is the bad cholesterol.  Over time and in combination with inflammation and other factors, this contributes to plaque which in turn may lead to stroke and/or heart attack down the road.  Sometimes high LDL is primarily genetic, and people might be eating all the right foods but still have high numbers.  Other times, there is room for improvement in one's diet and eating healthier can bring this number down and potentially reduce one's risk of heart attack and/or stroke.    To reduce your LDL, Remember - more fruits and vegetables, more fish, and limit red meat and dairy products.  More soy, nuts, beans, barley, lentils, oats and plant sterol ester enriched margarine instead of butter.  I also encourage eliminating sugar and processed food.  Remember, shop on the outside of the grocery store and visit your [employer]   If you would like to talk with me about dietary changes plus or minus medications for your cholesterol, please let me know. We  should recheck your cholesterol in 3-6 months.    -     Lipid Panel -     CMP14+EGFR  Psoriasiform dermatitis     -     Ambulatory referral to Dermatology -     anthralin (PSORIATEC) 1 % cream; Apply topically daily. -     CMP14+EGFR  Arthralgia of both knees -     Rheumatoid Arthritis Profile -     CMP14+EGFR  Colon cancer screening -     Ambulatory referral to Gastroenterology    Follow-up: No follow-ups on file.    MKerin Perna NP

## 2021-02-24 ENCOUNTER — Other Ambulatory Visit (INDEPENDENT_AMBULATORY_CARE_PROVIDER_SITE_OTHER): Payer: Self-pay | Admitting: Primary Care

## 2021-02-24 DIAGNOSIS — E7841 Elevated Lipoprotein(a): Secondary | ICD-10-CM

## 2021-02-24 LAB — LIPID PANEL
Chol/HDL Ratio: 3.4 ratio (ref 0.0–5.0)
Cholesterol, Total: 206 mg/dL — ABNORMAL HIGH (ref 100–199)
HDL: 60 mg/dL (ref >39)
LDL Chol Calc (NIH): 127 mg/dL — ABNORMAL HIGH (ref 0–99)
Triglycerides: 105 mg/dL (ref 0–149)
VLDL Cholesterol Cal: 19 mg/dL (ref 5–40)

## 2021-02-24 LAB — RHEUMATOID ARTHRITIS PROFILE
Cyclic Citrullin Peptide Ab: 6 units (ref 0–19)
Rhuematoid fact SerPl-aCnc: <10 IU/mL (ref <14.0)

## 2021-02-24 LAB — CMP14+EGFR
ALT: 21 IU/L (ref 0–44)
AST: 18 IU/L (ref 0–40)
Albumin/Globulin Ratio: 1.9 (ref 1.2–2.2)
Albumin: 4.4 g/dL (ref 3.8–4.9)
Alkaline Phosphatase: 93 IU/L (ref 44–121)
BUN/Creatinine Ratio: 14 (ref 10–24)
BUN: 11 mg/dL (ref 8–27)
Bilirubin Total: <0.2 mg/dL (ref 0.0–1.2)
CO2: 20 mmol/L (ref 20–29)
Calcium: 9.4 mg/dL (ref 8.6–10.2)
Chloride: 108 mmol/L — ABNORMAL HIGH (ref 96–106)
Creatinine, Ser: 0.8 mg/dL (ref 0.76–1.27)
Globulin, Total: 2.3 g/dL (ref 1.5–4.5)
Glucose: 79 mg/dL (ref 65–99)
Potassium: 4.2 mmol/L (ref 3.5–5.2)
Sodium: 142 mmol/L (ref 134–144)
Total Protein: 6.7 g/dL (ref 6.0–8.5)
eGFR: 101 mL/min/{1.73_m2} (ref >59)

## 2021-02-24 MED ORDER — PRAVASTATIN SODIUM 40 MG PO TABS: 40.0000 mg | ORAL_TABLET | Freq: Every day | ORAL | 1 refills | Status: DC

## 2021-08-23 ENCOUNTER — Encounter: Payer: Self-pay | Admitting: Primary Care

## 2021-12-21 ENCOUNTER — Encounter (INDEPENDENT_AMBULATORY_CARE_PROVIDER_SITE_OTHER): Payer: Self-pay | Admitting: Primary Care

## 2021-12-21 ENCOUNTER — Ambulatory Visit (INDEPENDENT_AMBULATORY_CARE_PROVIDER_SITE_OTHER): Payer: BC Managed Care – PPO | Admitting: Primary Care

## 2021-12-21 VITALS — BP 141/94 | HR 77 | Temp 98.0°F | Ht 71.0 in | Wt 192.2 lb

## 2021-12-21 DIAGNOSIS — L308 Other specified dermatitis: Secondary | ICD-10-CM

## 2021-12-21 DIAGNOSIS — R413 Other amnesia: Secondary | ICD-10-CM | POA: Diagnosis not present

## 2021-12-21 DIAGNOSIS — I10 Essential (primary) hypertension: Secondary | ICD-10-CM

## 2021-12-21 MED ORDER — HYDROCHLOROTHIAZIDE 25 MG PO TABS: 25.0000 mg | ORAL_TABLET | Freq: Every day | ORAL | 3 refills | Status: DC

## 2021-12-21 NOTE — Patient Instructions (Signed)
?????? ??? ???? ??? ???????? Hypertension, Adult ???? ??? ?????? ????? ????? ????? ?????? ?????? (?????? ??? ????)? ??? ????? ???? ?? ??????? ???? ???? ??? ?????. ????????? ?? ??????? ??????? ???? ???? ???? ?? ????? ??? ???? ????? ?????. ??? ??? ????? ???? ????? ??? ????? ???? ???? ??? ????? ??? ???? ???? ???????? ?? ??????. ??? ???? ??? ??? ????? ??? ????? ?? ??? ??????? ????? ???? ?? ???? ??? ???? ????? ?? ??? ????? ?? ???? ?????? ?? ??? ?? ????? ???? ???????? ??????. ???? ????? ??? ???? ????? ?? ??? ???? ??? ??? ????. ??? ?? ???? ????? ??? ???? ???????? ??? ??? 120/80. ??? ????? ????? ("??????") ????? ????? ?????????. ????? ????? ?? ??????? ?? ???? ????. ??? ????? ?????? ("??????") ????? ????? ?????????. ????? ????? ?? ??????? ??? ?????? ?????. ?? ????? ??? ??????? ????? ?????? ???? ?????? ??? ?????. ????? ??? ??????? ?????? ???? ???? ??? ?????? ??? ????. ?? ????? ????? ?????? ???? ????? ????? ?? ???? ?? ?????? ?????? ??????? ??? ????. ???? ????? ????? ??? ???? ???????? ?????:  ???????.  ??? ?????? ?? ???? ?? ???????? ?? ?????? ??????.  ????? ?????.  ????? ???? ????? ????? ??? ?????? ?? ?????? ?? ????? ?? ??????? ???????? ?? ????? (????????).  ??????? ?? ?????? ????????? ????????. ????? ????? ???????? ??????:  ??????? ?? ??? ?????? ????? ?? ?????? ?? ?????? ?????????? ?? ????? ?????.  ???????.  ???? ?????? ?? ??????? ??????? ??? ???? ????????????.  ??????? ??????? ????? ????????? ??????.  ????. ???? ????? ?? ???? ????. ?? ?????? ?????? ?? ???????? ?? ?? ???? ?????? ??? ???? ?? ?????. ??? ???????? ?????? (????? ??? ?????) ??? ????:  ??????.  ???? ??? ????? ?? ??? ?????? ??? ????? (????? ?????).  ??? ??????.  ???? ?????.  ??????? ??????.  ??????? ?? ????? ???????.  ??? ???? ?? ??????? ?????? ??????. ??? ????? ??? ??????? ????? ??? ?????? ????? ??? ??? ?? ????? ??????? ?? ??? ????? ??? ??? ????? ???? ????? ??? ????? ?? ??? ??? ??? ??? ???. ?? ????? ???? ???? ???? ??? ???? ?????? ???  ????? ?? ??????? ??????? ?? ????? ??? ????? ????. ???? ????? ??? ???? ????? ??? ????? ?? ?????? ????. ???? ????? ?????? ???? ?? ???? ???????? ?? ????? ????? ??? ????? ?????? ???????. ??? ??? ???? ??????? ?????? ??? ??? ?????? ?? ????? ????? ?? ??????? ???? ?? ???? ????? ????? ????? ??? ???? ???:  ?????? ?? ??? ??? ????? ??? ??? ??? ????.  ?????? ??? ??? ?? ?????? ???? ????? ?? ????. ??? ????? ????? ??? ???? ??? ?????? ??? ????? ??? ????? ???????? ?? ?? ?? ?????? ???? ??????? ???? ??????? ?????? ??????? ?? ??? ??? ??????? ?????? ??????? ??????. ??? ?????? ??? ??????? ???? ???? ??? ?????? ???????? ???? ?? ??? ?????? ??? ????? ??????? ?????? ?????? ?????? ??????? ???????? ?????? ????? ?????????. ?? ????? ???? ??????? ?????? ?????? ??? ?????????? ????? ?????? ??????? ????? ??????? ??????. ?? ??? ?? ????? ??????? ?????? ??????? ?? ????? ?? ??? ??? ????? ??? ?????? ??? ???? ??????? ??? ??? ???? ????? ????:  ??? ??? ???? ????????? ???? ???? ?? 130.  ??? ??? ???? ????????? ???? ???? ?? 80. ?? ????? ??? ???? ?????? ???????? ???? ???????? ??? ????? ?????? ????? ?????? ?? ???????. ???? ??? ????????? ?? ??????: ????? ??????   ???? ?????? ??????? ????? ??? ???? ????? ?? ??????? ??????????? ????? ????? ?? ???????? ?????? ?????? ???????. ??? ????? ??????? ???????? ???? DASH. ???? ??? (Dash) ??????? ??????? Dietary Approaches to Stop Hypertension ???? ???? ???? ????? ???? ??? ?????. ?????? ?????? ???? ???????: ? ??? ????? ???? ????? ?? ??????? ????????? ???????. ???? ??? ??? ???? ?? ?? ???? ???????? ?????????. ? ????? ?????? ??????? ??? ??????? ????? ?????? ?? ????? ????? ?? ??? ?????? ???????. ???? ??? ???? ??????? ??????? ???????. ? ????? ?? ???? ?????? ????? ????? ?????? ??? ????? ???? ????? ?? ??????? ???? ?????. ? ???? ????? ??? ?????? ??????? ??????? ???????? ?? ??????? ???? ?????? ???????. ???? ??? ???? ??????? ????????? ????? ?????? ??? ????? ??????? ????? ????? ?????? ?????? ???????. ? ???? ???????  ?????? ?????? ????????. ?????? ??? ??????? ??? ??? ????? ?? ???????? ?????? ?????? ???????.  ??? ?? ???? ???????? ???? ???? ???? ??????. ??????? ?? ???? ?????? ??? ???? ??? ????? ??? ???????? ?? ?????? ??   1500 ??? ??????.  ?? ?????? ????????? ??????? ?? ??????? ???????: ? ??? ????? ???? ??????? ?????? ??????? ?????? ???? ????? ?????????. ? ??? ???? ??????? ?? ?????? ?????? ?? ??????? ???? ?? ?????? ??????.  ??????? ?????? ????????? ????????: ? ??? ???? ??????? ???? ???????? ??? ????? ??????: ? ????? ???? ??? ???? ??????. ? ??????? ??? ???? ??????. ? ???? ???? ?????? ?? ?? ????? ???????. ?? ???????? ???????? ????? ??????? ?????? ???? ??? 12 ????? (355 ??) ?? ?????? ?? ??? ??? 5 ?????? (148 ??) ?? ?????? ?? ??? ??? ????? ???? (44 ??) ?? ????????? ???????? ??????. ??? ??????   ????? ?? ???? ??????? ?????? ??????? ?? ?????? ??? ??? ??? ?? ?????? ?????. ???? ?? ????? ??????? ??????? ??.  ???? ????? ?????? ???? 30 ????? ??? ????? ?????? ????? ???? (???????? ???????? ????????) ?? ???? ???? ???????. ?? ???? ??????? ???????? ????? ?? ??????? ?? ???? ???????.  ???? ?????? ????? ??????? (?????? ????????)? ??? ???????? ?? ??? ???????? ????? ?? ???? ??????? ????????. ???? ???? ??? ??????? ?? ???????? ???? 30 ????? ??? ????? 3 ???? ?? ???????.  ?? ?????? ?????? ????? ??? ????????? ?? ????? ???????. ????? ??? ??????? ???? ????? ?????? ??????? ???????????? ??? ??????? ???????????. ????? ???? ??????? ?????? ??? ??? ????? ??? ???????? ??????? ?? ???????.  ???? ??? ??? ?? ?????? ????? ???????? ???? ??????? ?????? ??????? ??.  ????? ????? ?????? ????????. ???? ??? ???. ???????  ????? ??????? ??? ??????? ???? ??????? ??????? ????? ????? ????? ???? ?? ??? ???? ????. ???? ????????? ??????. ????? ????? ??? ??? ???? ??? ??????? ??????.  ?? ???? ????? ????? ??? ????. ?????? ?? ??? ?? ????? ??????? ??? ???? ?? ?????? ??????.  ???? ???? ??????? ?????? ??????? ?? ?? ?????? ???????? ?? ??????? ??????? ???? ????? ????  ????????. ???? ???????? ??????? ?????? ?? ??????? ???????:  ??????? ??? ????? ?? ?? ??? ???? ??????? ???? ????????.  ??????? ????? ????? ????????.  ?????? ????????.  ??????? ????? ?? ??????.  ??????? ?????? ?? ???????. ???? ????????? ????? ?? ??????? ???????:  ??????? ????? ???? ?? ???? ????.  ?????? ???? ?? ????? ??? ?????.  ?????? ????????.  ?????? ???? ???? ?? ????? ?? ?????.  ????? ?????.  ???????? ?? ????? ?? ??????. ??? ???? ??? ??????? ????? ??? ???? ???? ?????. ???? ???????? ??? ?????. ???? ???? 911.  ?? ????? ?? ??? ???? ??????? ????? ?? ??.  ?? ??? ??????? ????? ??? ????????. ????  ???? ??? ????? ????? ???? ??? ???? ??????? ??? ??????? ???? ???? ??? ?????. ???? ?? ??? ?????? ?? ??? ??????? ??? ????? ?????? ??????? ???????? ?????.  ?? ????? ??? ???? ?????? ???????? ???? ???????? ??? ????? ?????? ????? ?????? ?? ???????. ??????? ????? ???????? ???? ??? ???? ??????? ??? ?? 120?/80.  ???? ???? ??? ????? ?????? ??? ?????? ?? ???????? ?? ??????. ????? ????????? ???? ????? ??????? ??? ??? ?????? ????? ????? ?????? ???? ????? ??? ????? ???????? ?????? ???????? ???????? ?????? ????? ?????????. ??? ????? ?? ??? ????????? ?? ???? ?????? ????????? ???? ?????? ???? ??????? ??????. ???? ?? ?????? ??? ????? ???? ?? ???? ?? ???? ??????? ??????.? Document Revised: 06/15/2021 Document Reviewed: 06/15/2021 Elsevier Patient Education  2023 [employer]  

## 2021-12-21 NOTE — Progress Notes (Signed)
?[facility] ? ?Jared Porter, is a 61 y.o. male ? ?INO:[id number] ? ?SJG:[id number] ? ?DOB - [birth year] ? ?Chief Complaint  ?Patient presents with  ? Memory Loss  ?  Wants to apply for citizenship but has been very forgetful lately would like PCP to give a report stating he has memory issues  ?    ? ?Subjective:  ?Interpreter  [place] [phone]  ?Jared Porter is a 61 y.o. male here today for he has been very forgetful lately needs I writing for citizenship . Reviewed records from 2018 no mention of memory loss . Ask for examples for memory loss when someone ask him a question he forgets what the question was. Patient has No headache, No chest pain, No abdominal pain - No Nausea, No new weakness tingling or numbness, No Cough - shortness of breath ? ?No problems updated. ? ?No Known Allergies ? ?No past medical history on file. ? ?Current Outpatient Medications on File Prior to Visit  ?Medication Sig Dispense Refill  ? methotrexate 2.5 MG tablet TAKE 4 TABLETS BY MOUTH ONCE A WEEK (Patient not taking: Reported on 02/07/2019) 48 tablet 3  ? pimecrolimus (ELIDEL) 1 % cream Apply topically 2 (two) times daily. (Patient not taking: Reported on 12/21/2021) 120 g 1  ? pravastatin (PRAVACHOL) 40 MG tablet Take 1 tablet (40 mg total) by mouth daily. (Patient not taking: Reported on 12/21/2021) 90 tablet 1  ? ?No current facility-administered medications on file prior to visit.  ? ? ?Objective:  ? ?Vitals:  ? 12/21/21 1340  ?BP: (!) 141/94  ?Pulse: 77  ?Temp: 98 ?F (36.7 ?C)  ?TempSrc: Oral  ?SpO2: 96%  ?Weight: 192 lb 3.2 oz (87.2 kg)  ?Height: 5\' 11"  (1.803 m)  ? ? ?Exam ?General appearance : Awake, alert, not in any distress. Speech Clear. Not toxic looking ?HEENT: Atraumatic and Normocephalic, pupils equally reactive to light and accomodation ?Neck: Supple, no JVD. No cervical lymphadenopathy.  ?Chest: Good air entry bilaterally, no added sounds  ?CVS: S1 S2 regular, no murmurs.  ?Abdomen: Bowel  sounds present, Non tender and not distended with no gaurding, rigidity or rebound. ?Extremities: B/L Lower Ext shows no edema, both legs are warm to touch ?Neurology: Awake alert, and oriented X 3, Non focal ?Skin: No Rash ? ?Data Review ?Lab Results  ?Component Value Date  ? HGBA1C 5.6 05/08/2017  ? ? ?Assessment & Plan  ? ?Memory loss ? ?Jared Porter was seen today for memory loss. ? ?Diagnoses and all orders for this visit: ? ?Memory loss ?mini-mental status exam  ?A third test, known as the Mini-Cog, takes 2 to 4 minutes to administer and involves asking patients to recall three words ( unable to remember 2/3) after drawing a picture of a clock. Able to draw with out numbers (Arabic) If a patient shows no difficulties recalling the words, it is inferred that he or she does not have dementia. Explained unable to determine if memory is a issue or language barrier. Will refer neurology  ? ? Psoriasiform dermatitis ?Stop taking medication due to affordability  ? ?Patient have been counseled extensively about nutrition and exercise. Other issues discussed during this visit include: low cholesterol diet, weight control and daily exercise, foot care, annual eye examinations at Ophthalmology, importance of adherence with medications and regular follow-up. We also discussed long term complications of uncontrolled diabetes and hypertension.  ? ? ? ?The patient was given clear instructions to go to ER or return to medical center if symptoms don't  improve, worsen or new problems develop. The patient verbalized understanding. The patient was told to call to get lab results if they haven't heard anything in the next week.  ? ?This note has been created with Education officer, environmental. Any transcriptional errors are unintentional.  ? ?Grayce Sessions, NP ?12/21/2021, 1:47 PM ? ?

## 2022-01-12 ENCOUNTER — Ambulatory Visit (INDEPENDENT_AMBULATORY_CARE_PROVIDER_SITE_OTHER): Payer: BC Managed Care – PPO | Admitting: Primary Care

## 2022-01-24 ENCOUNTER — Ambulatory Visit (INDEPENDENT_AMBULATORY_CARE_PROVIDER_SITE_OTHER): Payer: BC Managed Care – PPO | Admitting: Primary Care

## 2022-01-24 ENCOUNTER — Encounter (INDEPENDENT_AMBULATORY_CARE_PROVIDER_SITE_OTHER): Payer: Self-pay | Admitting: Primary Care

## 2022-01-24 VITALS — BP 144/90 | HR 81 | Temp 98.2°F | Ht 71.0 in | Wt 193.8 lb

## 2022-01-24 DIAGNOSIS — I10 Essential (primary) hypertension: Secondary | ICD-10-CM | POA: Diagnosis not present

## 2022-01-24 DIAGNOSIS — R413 Other amnesia: Secondary | ICD-10-CM | POA: Diagnosis not present

## 2022-01-24 NOTE — Progress Notes (Signed)
[facility]  Jared Porter, is a 61 y.o. male  OZH:[id number]  GEX:[id number]  DOB - [birth year]  Chief Complaint  Patient presents with   Blood Pressure Check       Subjective:   Jared Porter is a 61 y.o. Arabic male( interpreter Zina [phone]) here today for a follow up visit for HTN. Patient denies having HTN and not taking medication. He feels like stress is the cause of it being elevated not seeing his family for 5 years - missing them if no stress no HTN. Admits to Headaches but goes away he takes no medication - tea or coffee makes them go away. Patient has No chest pain, No abdominal pain - No Nausea, No new weakness tingling or numbness, No Cough - shortness of breath  No problems updated.  No Known Allergies  No past medical history on file.  Current Outpatient Medications on File Prior to Visit  Medication Sig Dispense Refill   methotrexate 2.5 MG tablet TAKE 4 TABLETS BY MOUTH ONCE A WEEK (Patient not taking: Reported on 02/07/2019) 48 tablet 3   pimecrolimus (ELIDEL) 1 % cream Apply topically 2 (two) times daily. (Patient not taking: Reported on 12/21/2021) 120 g 1   No current facility-administered medications on file prior to visit.    Objective:   Vitals:   01/24/22 1514  BP: (!) 144/90  Pulse: 81  Temp: 98.2 F (36.8 C)  TempSrc: Oral  SpO2: 97%  Weight: 193 lb 12.8 oz (87.9 kg)  Height: 5\' 11"  (1.803 m)    Exam General appearance : Awake, alert, not in any distress. Speech Clear. Not toxic looking HEENT: Atraumatic and Normocephalic, pupils equally reactive to light and accomodation Neck: Supple, no JVD. No cervical lymphadenopathy.  Chest: Good air entry bilaterally, no added sounds  CVS: S1 S2 regular, no murmurs.  Abdomen: Bowel sounds present, Non tender and not distended with no gaurding, rigidity or rebound. Extremities: B/L Lower Ext shows no edema, both legs are warm to touch Neurology: Awake alert, and  oriented X 3,  Non focal Skin: No Rash  Data Review Lab Results  Component Value Date   HGBA1C 5.6 05/08/2017    Assessment & Plan  Jared Porter was seen today for blood pressure check.  Diagnoses and all orders for this visit:  Essential hypertension Discussed diet and foods to avoid or reduce intake can foods, pork, salt, seasoning , ( heart a muscle works harder elevated bp)  BP goal - < 140/90 Explained that having normal blood pressure is the goal .DIET: Limit salt intake, read nutrition labels to check salt content, limit fried and high fatty foods  Avoid using multisymptom OTC cold preparations that generally contain sudafed which can rise BP. Consult with pharmacist on best cold relief products to use for persons with HTN   Memory loss mini-mental status exam   Mini-Cog, recall three words ( unable to remember 2/3) after drawing a picture of a clock. Able to draw with out numbers (Arabic) . Explained unable to determine if memory is a issue or language barrier. -     Ambulatory referral to Neurology    Patient have been counseled extensively about nutrition and exercise. Other issues discussed during this visit include: low cholesterol diet, weight control and daily exercise, foot care, annual eye examinations at Ophthalmology, importance of adherence with medications and regular follow-up. We also discussed long term complications of uncontrolled diabetes and hypertension.   No follow-ups on file.  The patient was  given clear instructions to go to ER or return to medical center if symptoms don't improve, worsen or new problems develop. The patient verbalized understanding. The patient was told to call to get lab results if they haven't heard anything in the next week.   This note has been created with Education officer, environmental. Any transcriptional errors are unintentional.   Grayce Sessions, NP 01/24/2022, 3:36 PM

## 2022-02-25 ENCOUNTER — Encounter: Payer: Self-pay | Admitting: Family Medicine

## 2022-02-25 ENCOUNTER — Ambulatory Visit (INDEPENDENT_AMBULATORY_CARE_PROVIDER_SITE_OTHER): Payer: BC Managed Care – PPO | Admitting: Family Medicine

## 2022-02-25 VITALS — BP 146/96 | HR 74 | Ht 71.0 in | Wt 190.8 lb

## 2022-02-25 DIAGNOSIS — Z716 Tobacco abuse counseling: Secondary | ICD-10-CM

## 2022-02-25 DIAGNOSIS — L409 Psoriasis, unspecified: Secondary | ICD-10-CM

## 2022-02-25 DIAGNOSIS — E785 Hyperlipidemia, unspecified: Secondary | ICD-10-CM

## 2022-02-25 DIAGNOSIS — L308 Other specified dermatitis: Secondary | ICD-10-CM

## 2022-02-25 DIAGNOSIS — I10 Essential (primary) hypertension: Secondary | ICD-10-CM

## 2022-02-25 DIAGNOSIS — Z720 Tobacco use: Secondary | ICD-10-CM

## 2022-02-25 DIAGNOSIS — F4329 Adjustment disorder with other symptoms: Secondary | ICD-10-CM

## 2022-02-25 MED ORDER — AMLODIPINE BESYLATE 5 MG PO TABS: 5.0000 mg | ORAL_TABLET | Freq: Every day | ORAL | 3 refills | Status: DC

## 2022-02-25 MED ORDER — CLOBETASOL PROPIONATE 0.05 % EX OINT: 1.0000 | TOPICAL_OINTMENT | Freq: Two times a day (BID) | CUTANEOUS | 2 refills | Status: DC

## 2022-02-25 MED ORDER — CHANTIX STARTING MONTH PAK 0.5 MG X 11 & 1 MG X 42 PO TBPK: ORAL_TABLET | ORAL | 1 refills | Status: DC

## 2022-02-25 MED ORDER — NICOTINE 7 MG/24HR TD PT24: 7.0000 mg | MEDICATED_PATCH | Freq: Every day | TRANSDERMAL | 2 refills | Status: DC

## 2022-02-25 NOTE — Progress Notes (Signed)
    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  Eyes:     Extraocular Movements: Extraocular movements intact.     Conjunctiva/sclera: Conjunctivae normal.     Pupils: Pupils are equal, round, and reactive to light.  Cardiovascular:     Rate and Rhythm: Normal rate and regular rhythm.     Pulses: Normal pulses.     Heart sounds: Normal heart sounds.  Pulmonary:     Effort: Pulmonary effort is normal.     Breath sounds: Normal breath sounds.  Abdominal:     General: Abdomen is flat.     Palpations: Abdomen is soft.  Musculoskeletal:        General: Normal range of motion.  Skin:    General: Skin is warm and dry.     Comments: Bilateral arms with large white dry plaques, well demarcated   Neurological:     Mental Status: He is alert.  Psychiatric:        Mood and Affect: Mood normal.        Behavior: Behavior normal.        Thought Content: Thought content normal.        Judgment: Judgment normal.      ASSESSMENT/PLAN:   Tobacco use Smokes 1/2 PPD, sometimes 1 PPD on the weekend. Wants to quit  - Will start on Chantix with end date in 3 weeks (August  11th)  - 7 mg nicotine patches   Primary hypertension History of hypertension seen in previous notes. Never been on medication. 146/96 today in clinic.  - Started 5 mg amlodipine daily  - Exercises as he has an intense job  - Discussed risks of hypertension   Stress and adjustment reaction Stress secondary to immigration and being apart from his family for 5 years +. Very upsetting for him.   - Coping strategies include religion, and being more connected to the community.  - SMART goals created for listening to music for 5 minutes every day.   Psoriasis Biopsy proven psoriasis. Previously prescribed methotrexate but never took. Says he has previously been to a dermatologist, but did not help.  - Prescribed clobetazol 0.05%  - dermatology referral for immunologic therapy    [birth year]-09-1991, MD [facility]

## 2022-02-25 NOTE — Assessment & Plan Note (Signed)
History of hypertension seen in previous notes. Never been on medication. 146/96 today in clinic.  - Started 5 mg amlodipine daily  - Exercises as he has an intense job  - Discussed risks of hypertension

## 2022-02-25 NOTE — Assessment & Plan Note (Signed)
Stress secondary to immigration and being apart from his family for 5 years +. Very upsetting for him.   - Coping strategies include religion, and being more connected to the community.  - SMART goals created for listening to music for 5 minutes every day.

## 2022-02-25 NOTE — Patient Instructions (Addendum)
To start Chantix, 0.5 mg daily x 3 days, 0.5 mg BID for days 4-7, to 1 mg BID from day 8 to end of treatment  Quit date 3 weeks from now on the 11th of August.   Take one pill, amlodipine a day for high blood pressure. Use anti stress coping strategies like listening to music 5 mins per day.   I will prescribe and ointment for you to apply on your arms twice a day.  Please go to the dermatologist to follow up with this.

## 2022-02-25 NOTE — Assessment & Plan Note (Signed)
Biopsy proven psoriasis. Previously prescribed methotrexate but never took. Says he has previously been to a dermatologist, but did not help.  - Prescribed clobetazol 0.05%  - dermatology referral for immunologic therapy

## 2022-02-25 NOTE — Assessment & Plan Note (Signed)
Smokes 1/2 PPD, sometimes 1 PPD on the weekend. Wants to quit  - Will start on Chantix with end date in 3 weeks (August 11th)  - 7 mg nicotine patches

## 2022-02-26 LAB — COMPREHENSIVE METABOLIC PANEL
ALT: 18 IU/L (ref 0–44)
AST: 23 IU/L (ref 0–40)
Albumin/Globulin Ratio: 1.7 (ref 1.2–2.2)
Albumin: 4.4 g/dL (ref 3.9–4.9)
Alkaline Phosphatase: 83 IU/L (ref 44–121)
BUN/Creatinine Ratio: 11 (ref 10–24)
BUN: 9 mg/dL (ref 8–27)
Bilirubin Total: 0.4 mg/dL (ref 0.0–1.2)
CO2: 21 mmol/L (ref 20–29)
Calcium: 9.8 mg/dL (ref 8.6–10.2)
Chloride: 105 mmol/L (ref 96–106)
Creatinine, Ser: 0.81 mg/dL (ref 0.76–1.27)
Globulin, Total: 2.6 g/dL (ref 1.5–4.5)
Glucose: 98 mg/dL (ref 70–99)
Potassium: 3.9 mmol/L (ref 3.5–5.2)
Sodium: 141 mmol/L (ref 134–144)
Total Protein: 7 g/dL (ref 6.0–8.5)
eGFR: 100 mL/min/{1.73_m2} (ref >59)

## 2022-02-26 LAB — LIPID PANEL
Chol/HDL Ratio: 3.4 ratio (ref 0.0–5.0)
Cholesterol, Total: 198 mg/dL (ref 100–199)
HDL: 58 mg/dL (ref >39)
LDL Chol Calc (NIH): 125 mg/dL — ABNORMAL HIGH (ref 0–99)
Triglycerides: 83 mg/dL (ref 0–149)
VLDL Cholesterol Cal: 15 mg/dL (ref 5–40)

## 2022-02-26 LAB — CBC
Hematocrit: 44.3 % (ref 37.5–51.0)
Hemoglobin: 15.4 g/dL (ref 13.0–17.7)
MCH: 31.4 pg (ref 26.6–33.0)
MCHC: 34.8 g/dL (ref 31.5–35.7)
MCV: 90 fL (ref 79–97)
Platelets: 251 10*3/uL (ref 150–450)
RBC: 4.9 x10E6/uL (ref 4.14–5.80)
RDW: 11.9 % (ref 11.6–15.4)
WBC: 4.8 10*3/uL (ref 3.4–10.8)

## 2022-02-28 MED ORDER — ATORVASTATIN CALCIUM 40 MG PO TABS: 40.0000 mg | ORAL_TABLET | Freq: Every day | ORAL | 0 refills | Status: DC

## 2022-02-28 NOTE — Progress Notes (Signed)
Patient called using interpreter. Discussed starting a statin

## 2022-02-28 NOTE — Addendum Note (Signed)
Addended by: Lockie Mola on: 02/28/2022 04:25 PM   Modules accepted: Orders

## 2022-03-28 ENCOUNTER — Ambulatory Visit (INDEPENDENT_AMBULATORY_CARE_PROVIDER_SITE_OTHER): Payer: BC Managed Care – PPO | Admitting: Diagnostic Neuroimaging

## 2022-03-28 ENCOUNTER — Encounter: Payer: Self-pay | Admitting: Diagnostic Neuroimaging

## 2022-03-28 VITALS — BP 124/85 | HR 80 | Wt 193.0 lb

## 2022-03-28 DIAGNOSIS — R413 Other amnesia: Secondary | ICD-10-CM | POA: Diagnosis not present

## 2022-03-28 NOTE — Patient Instructions (Signed)
  MEMORY LOSS / DIFFICULTY LEARNING NEW INFORMATION - 6th grade education; primary language arabic; grew up in [place]; in [place] x 5 years; diff learning english and studying for citizenship test; also diff with remembering other events, tasks (losing keys, leaving stove on) - will check MRI brain, labs

## 2022-03-28 NOTE — Progress Notes (Signed)
[facility]  PATIENT: Jared Porter DOB: 11-19-1960  REFERRING CLINICIAN: Grayce Sessions, NP HISTORY FROM: patient VIA INTERPRETER (arabic language) REASON FOR VISIT: new consult   HISTORICAL  CHIEF COMPLAINT:  Chief Complaint  Patient presents with   Memory Loss    Rm 7 New Pt  interpreter- Maha   MMSE 8    HISTORY OF PRESENT ILLNESS:   61 year old male here for evaluation of memory loss / cognitive difficulties.  Patient was born in [place] and educated to the sixth grade.  Around 2011 he and family moved to [place] as political refugees.  Around 2018 he was able to moved to the [place].  He has been working and was able to get a [place] green card.  He does not have a [place] passport currently, and therefore is unable to travel back to [place] to visit his family. He is trying to obtain [place] citizenship currently.  Unfortunately he is having difficulty learning English and learning the information to past citizenship exam.  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  He has taken several classes at local churches and community centers to help learn English.  Patient also has been having some memory issues such as losing his keys, leaving the stove on, forgetting to flush the toilet.  He was living with a roommate but currently living alone.  He is able to maintain most of his ADLs himself, currently working and taking care of his home.    REVIEW OF SYSTEMS: Full 14 system review of systems performed and negative with exception of: as per HPI.  ALLERGIES: No Known Allergies  HOME MEDICATIONS: Outpatient Medications Prior to Visit  Medication Sig Dispense Refill   amLODipine (NORVASC) 5 MG tablet Take 1 tablet (5 mg total) by mouth at bedtime. 90 tablet 3   atorvastatin (LIPITOR) 40 MG tablet Take 1 tablet (40 mg total) by mouth daily. 90 tablet 0   clobetasol ointment (TEMOVATE) 0.05 % Apply 1 Application topically 2 (two) times  daily. 60 g 2   nicotine (NICODERM CQ - DOSED IN MG/24 HR) 7 mg/24hr patch Place 1 patch (7 mg total) onto the skin daily. 28 patch 2   Varenicline Tartrate, Starter, (CHANTIX STARTING MONTH PAK) 0.5 MG X 11 & 1 MG X 42 TBPK Take per pack instructions. Start with 0.5 mg daily x 3 days, 0.5 mg twice daily for days 4-7, and then start 1 mg twice daily 1 each 1   No facility-administered medications prior to visit.    PAST MEDICAL HISTORY: Past Medical History:  Diagnosis Date   High cholesterol    Hypertension    Seasonal allergies     PAST SURGICAL HISTORY: Past Surgical History:  Procedure Laterality Date   DENTAL SURGERY      FAMILY HISTORY: No family history on file.  SOCIAL HISTORY: Social History   Socioeconomic History   Marital status: Single    Spouse name: Not on file   Number of children: 3   Years of education: 8   Highest education level: Not on file  Occupational History    Comment: [employer]  Tobacco Use   Smoking status: Every Day    Packs/day: 0.50    Types: Cigarettes   Smokeless tobacco: Never  Vaping Use   Vaping Use: Never used  Substance and Sexual Activity   Alcohol use: Never   Drug use: Never   Sexual activity: Not on file  Other Topics Concern  Not on file  Social History Narrative   Lives alone   Social Determinants of Health   Financial Resource Strain: Not on file  Food Insecurity: Not on file  Transportation Needs: Not on file  Physical Activity: Not on file  Stress: Not on file  Social Connections: Not on file  Intimate Partner Violence: Not on file     PHYSICAL EXAM  GENERAL EXAM/CONSTITUTIONAL: Vitals:  Vitals:   03/28/22 1025  BP: 124/85  Pulse: 80  Weight: 193 lb (87.5 kg)   Body mass index is 26.92 kg/m. Wt Readings from Last 3 Encounters:  03/28/22 193 lb (87.5 kg)  02/25/22 190 lb 12.8 oz (86.5 kg)  01/24/22 193 lb 12.8 oz (87.9 kg)   Patient is in no distress; well developed, nourished and groomed; neck  is supple  CARDIOVASCULAR: Examination of carotid arteries is normal; no carotid bruits Regular rate and rhythm, no murmurs Examination of peripheral vascular system by observation and palpation is normal  EYES: Ophthalmoscopic exam of optic discs and posterior segments is normal; no papilledema or hemorrhages No results found.  MUSCULOSKELETAL: Gait, strength, tone, movements noted in Neurologic exam below  NEUROLOGIC: MENTAL STATUS:     03/28/2022   10:31 AM  MMSE - Mini Mental State Exam  Orientation to time 1  Orientation to Place 1  Registration 0  Attention/ Calculation 0  Recall 0  Language- name 2 objects 2  Language- repeat 0  Language- follow 3 step command 2  Language- read & follow direction 1  Write a sentence 1  Write a sentence-comments was able to speak complete sentence, couldn't write it  Copy design 0  Total score 8   awake, alert, oriented to person, place and time recent and remote memory intact normal attention and concentration language fluent, comprehension intact, naming intact fund of knowledge appropriate  CRANIAL NERVE:  2nd - no papilledema on fundoscopic exam 2nd, 3rd, 4th, 6th - pupils equal and reactive to light, visual fields full to confrontation, extraocular muscles intact, no nystagmus 5th - facial sensation symmetric 7th - facial strength symmetric 8th - hearing intact 9th - palate elevates symmetrically, uvula midline 11th - shoulder shrug symmetric 12th - tongue protrusion midline  MOTOR:  normal bulk and tone, DIFFUSE 4/5 strength in the BUE; BLE 3 (LEFT WORSE THAN RIGHT)  SENSORY:  normal and symmetric to light touch, temperature, vibration  COORDINATION:  finger-nose-finger, fine finger movements normal  REFLEXES:  deep tendon reflexes present and symmetric  GAIT/STATION:  narrow based gait    DIAGNOSTIC DATA (LABS, IMAGING, TESTING) - I reviewed patient records, labs, notes, testing and imaging myself where  available.  Lab Results  Component Value Date   WBC 4.8 02/25/2022   HGB 15.4 02/25/2022   HCT 44.3 02/25/2022   MCV 90 02/25/2022   PLT 251 02/25/2022      Component Value Date/Time   NA 141 02/25/2022 1555   K 3.9 02/25/2022 1555   CL 105 02/25/2022 1555   CO2 21 02/25/2022 1555   GLUCOSE 98 02/25/2022 1555   BUN 9 02/25/2022 1555   CREATININE 0.81 02/25/2022 1555   CALCIUM 9.8 02/25/2022 1555   PROT 7.0 02/25/2022 1555   ALBUMIN 4.4 02/25/2022 1555   AST 23 02/25/2022 1555   ALT 18 02/25/2022 1555   ALKPHOS 83 02/25/2022 1555   BILITOT 0.4 02/25/2022 1555   GFRNONAA 99 05/16/2019 1357   GFRAA 115 05/16/2019 1357   Lab Results  Component Value Date  CHOL 198 02/25/2022   HDL 58 02/25/2022   LDLCALC 125 (H) 02/25/2022   TRIG 83 02/25/2022   CHOLHDL 3.4 02/25/2022   Lab Results  Component Value Date   HGBA1C 5.6 05/08/2017   No results found for: "VITAMINB12" No results found for: "TSH"    ASSESSMENT AND PLAN  61 y.o. year old male here with:   Dx:  1. Memory loss      PLAN:  MEMORY LOSS / DIFFICULTY LEARNING NEW INFORMATION (could be mild cognitive impairent; need to rule out other causes; dementia less likely as he is maintaining his other ADLs) - 6th grade education; primary language arabic; grew up in [place]; in [place] x 5 years; diff learning english and studying for citizenship test; also diff with remembering other events, tasks (losing keys, leaving stove on) - will check MRI brain, labs  Orders Placed This Encounter  Procedures   MR BRAIN W WO CONTRAST   Vitamin B12   TSH   Return for pending test results, pending if symptoms worsen or fail to improve.    Suanne Marker, MD 03/28/2022, 11:08 AM Certified in Neurology, Neurophysiology and Neuroimaging  [facility] [address] [place], [place] [address] [phone]

## 2022-03-29 LAB — VITAMIN B12: Vitamin B-12: 392 pg/mL (ref 232–1245)

## 2022-03-29 LAB — TSH: TSH: 3.81 u[IU]/mL (ref 0.450–4.500)

## 2022-04-06 ENCOUNTER — Telehealth: Payer: Self-pay

## 2022-04-06 NOTE — Telephone Encounter (Signed)
Contacted pt with language assistant Ameine, informed pt labs are normal. Advised to call office back with questions as he had none at this time and was apprecive

## 2022-04-06 NOTE — Telephone Encounter (Signed)
-----   Message from Suanne Marker, MD sent at 04/05/2022  5:24 PM EDT ----- Normal labs. Please call patient. -VRP

## 2022-04-18 ENCOUNTER — Telehealth: Payer: Self-pay | Admitting: Diagnostic Neuroimaging

## 2022-04-18 NOTE — Telephone Encounter (Signed)
Jared Porter: [id number] exp. 04/18/22-06/16/22 sent to GI per [facility]

## 2022-05-02 ENCOUNTER — Ambulatory Visit
Admission: RE | Admit: 2022-05-02 | Discharge: 2022-05-02 | Disposition: A | Discharge status: Home / Self Care | Payer: BC Managed Care – PPO | Source: Ambulatory Visit | Attending: Diagnostic Neuroimaging | Admitting: Diagnostic Neuroimaging

## 2022-05-02 DIAGNOSIS — R413 Other amnesia: Secondary | ICD-10-CM

## 2022-05-02 MED ORDER — GADOBENATE DIMEGLUMINE 529 MG/ML IV SOLN
18.0000 mL | Freq: Once | INTRAVENOUS | Status: AC | PRN
  Administered 2022-05-02: 18 mL via INTRAVENOUS

## 2022-05-26 ENCOUNTER — Telehealth: Payer: Self-pay | Admitting: *Deleted

## 2022-05-26 NOTE — Telephone Encounter (Signed)
Called language line, interpreter [phone] [place] who called patient. No answer, no voice mail. Will have to try later.

## 2022-05-30 NOTE — Telephone Encounter (Signed)
Called [facility] interpreters, int [phone] Amed who called patient to give MRI brain results, advised he may leave a message. He stated the # was invalid, tried alternate # per demographics. He LVM with # for call back if needed. He stated he wasn't sure the # was a personal phone and tried 1st # again. 1st # "not available".

## 2022-06-11 ENCOUNTER — Other Ambulatory Visit: Payer: Self-pay | Admitting: Family Medicine

## 2022-06-11 DIAGNOSIS — E785 Hyperlipidemia, unspecified: Secondary | ICD-10-CM

## 2022-08-30 ENCOUNTER — Ambulatory Visit: Payer: 59 | Admitting: Family Medicine

## 2022-08-30 VITALS — BP 135/92 | HR 81 | Wt 197.0 lb

## 2022-08-30 DIAGNOSIS — I10 Essential (primary) hypertension: Secondary | ICD-10-CM

## 2022-08-30 DIAGNOSIS — F4329 Adjustment disorder with other symptoms: Secondary | ICD-10-CM | POA: Diagnosis not present

## 2022-08-30 DIAGNOSIS — Z720 Tobacco use: Secondary | ICD-10-CM

## 2022-08-30 DIAGNOSIS — R4189 Other symptoms and signs involving cognitive functions and awareness: Secondary | ICD-10-CM | POA: Diagnosis not present

## 2022-08-30 DIAGNOSIS — L409 Psoriasis, unspecified: Secondary | ICD-10-CM

## 2022-08-30 NOTE — Assessment & Plan Note (Signed)
On amlodipine 5 mg daily.  Patient is poorly adherent with medication regimen because of memory difficulties. 135/92 today, did not take AM amlodipine dose.  Was 146/96 in July 2023.  Will follow up at next medical appointment and discuss improving medication adherence.
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
## 2022-08-30 NOTE — Assessment & Plan Note (Signed)
Continues to experience significant stress due to citizenship process and being away from family for many years.  Becomes tearful when discussing family.  Does endorse symptoms of PTSD and depression on Refugee Health Screener.  However, states that he is feeling safer in the [place] and experiencing fewer flashbacks and nightmares here.  Will follow up at next appointment.

## 2022-08-30 NOTE — Assessment & Plan Note (Signed)
Still smoking 1/2 ppd, unable to quit.  Patient deferred discussion of quitting to next appointment.  Does not remember if he took Chantix that was previously prescribed.
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
## 2022-08-30 NOTE — Progress Notes (Deleted)
SUBJECTIVE:   CHIEF COMPLAINT / HPI:   In-person arabic interpreter *** used.  Neurology: MRI 9/25 Left frontal nonenhancing T2 hyperintense lesion measuring 8 mm in diameter. May represent focus of chronic microvascular ischemic disease  ***  Tobacco use Smokes 1/2 PPD, sometimes 1 PPD on the weekend. Wants to quit  - Will start on Chantix with end date in 3 weeks (August 11th)  - 7 mg nicotine patches    Primary hypertension History of hypertension seen in previous notes. Never been on medication. 146/96 today in clinic.  Forgot dose today, sometimes forgets doses, normally takes once daily - Started 5 mg amlodipine daily  - Exercises as he has an intense job  - Discussed risks of hypertension    Stress and adjustment reaction Stress secondary to immigration and being apart from his family for 5 years +. Very upsetting for him.  - Coping strategies include religion, and being more connected to the community.  - SMART goals created for listening to music for 5 minutes every day.     Psoriasis Biopsy proven psoriasis. Previously prescribed methotrexate but never took. Says he has previously been to a dermatologist, but did not help.   Tried clobetasol 0.05% until ran out, did not work. Did not see dermatologist.   PERTINENT  PMH / [place]: ***  OBJECTIVE:   BP (!) 135/92   Pulse 81   Wt 197 lb (89.4 kg)   SpO2 100%   BMI 27.48 kg/m   ***  ASSESSMENT/PLAN:   No problem-specific Assessment & Plan notes found for this encounter.     [place]        Patient Name: Jared Porter Date of Birth: 12-10-1960 Date of Visit: 08/30/22 PCP: Lowry Ram, MD  Chief Complaint: refugee intake examination and ***   The patient's preferred language is ***. An interpreter was used for the entire visit.  Interpreter Name or ID: ***    Subjective: Jared Porter is a pleasant 62 y.o.  presenting today for an initial refugee and immigrant clinic visit.    ROS: ***   PMH: ***  PSH: ***   FH: ***  Allergies:  ***   Current Medications:  ***   Social History: Tobacco Use: *** Alcohol Use: ***  In the past two weeks, have you run out of food before you had money to purchase more? ***  In the past two weeks, have you had difficulty with obtaining food for your family? ***  Refugee Information     Date of Overseas Exam: *** Review of Overseas Exam: {yes/no:20286} Pre-Departure Treatment: ***  Overseas Vaccines Reviewed and Updated in Epic {yes/no:20286}   Vitals:   08/30/22 1331  BP: (!) 135/92  Pulse: 81  SpO2: 100%   HEENT: Sclera anicteric. Dentition is *** . Appears well hydrated. Neck: Supple Cardiac: Regular rate and rhythm. Normal S1/S2. No murmurs, rubs, or gallops appreciated. Lungs: Clear bilaterally to ascultation.  Abdomen: Normoactive bowel sounds. No tenderness to deep or light palpation. No rebound or guarding. Splenomegaly ***  Extremities: Warm, well perfused without edema.  Skin: ***  Psych: Pleasant and appropriate  MSK: ***   No problem-specific Assessment & Plan notes found for this encounter.    I have personally updated the history tabs within Epic and included the refugee information in social documentation. ***   Research officer, political party signed with agency ***.   Release of information signed for Health Department ***.  Return to care in 1 month in [facility] with resident physician and PCP ***.   Vaccines: ***   Labs to Order if not completed at Health Department: *** DELETE THIS PART when SIGNING NOTE ***  - CBC with differential (looking for eosinophilia) - CMET  - HIV - Hepatitis C for all adults  - RPR (those 15 and over)  - Varicella IgG if 19 years or older, unless immunization documented - MMR titer (unless prior vaccination)  - Hepatitis B surface antigen, surface antibody, and core antibody (adults-  children can have just surface AG)  - GC/CT if sexually active or risk factors  - Quantiferon or TST  (if not done/goin to HD) - Lipids - Strongyloides if from [place] region without pre departure ivermectin  - HbA1C if overweight or obese  - B12 if [place] or [place]  - Sickle cell screen  - Urine dipstick  - Age 24-16, lead (repeat 3-6 months later)

## 2022-08-30 NOTE — Patient Instructions (Addendum)
I have translated the following text using Google translate.  As such, there are many errors.  I apologize for the poor written translation; however, we do not have written  translation services yet. It was wonderful to meet you today. Thank you for allowing me to be a part of your care. Below is a short summary of what we discussed at your visit today: ??? ??? ?????? ???? ?????? ???????? ????? ????. ???? ??? ?????? ???? ?????? ?? ???????. ????? ?? ??????? ???????? ??????? ??? ???? ??? ????? ????? ????? ?????? ??? ????. ??? ?? ?????? ??????? ?????. ????? ?? ??? ?????? ?? ??? ???? ????? ?? ??????. ???? ??? ???? ???? ??? ??????? ???? ?????? ?????:  N648 - citizenship test exemption form We need your alien ID card in order to complete your government paperwork. The ID card has your photo on it and your ID number starts with "A-" followed by some numbers.   We will contact [facility] [employer] to see if they can help you get another card.   Once you have your alien ID card, you will come back for a shorter visit with Dr. Thompson Grayer to review and sign the N-648 form.   We may involve [facility] to help with your citizenship process. Information below: [employer], [place], [place] [address] Phone: [phone] Fax: [phone] Website: [email or web address] [phone] - ??????? ??????? ?? ?????? ??????? ??? ????? ??? ????? ???? ??????? ?????? ?? ?????? ??????? ???????? ?????? ??. ????? ????? ?????? ??? ????? ???? ????? ???? ?????? "A-" ??????? ???? ???????.  ??? ???? ?? [employer] ?????? ?? ??? ??? ???????? ??????? ?? ?????? ??? ????? ????.  ????? ????? ??? ????? ???? ??????? ?????? ??? ????? ?????? ????? ?? ??????? ???? ??????? ????? N-648 ???????? ????. ?? ???? ?????? ????? [employer] ???????? ?? ????? ?????? ??? ??????? ?????? ??. ????????? ???: ???? ?????? ?????? ?????? ?????  ????? ?????? ????????? [id number] ?????????? ???? ????????? [id number] ????: [phone] ????: [phone] ???? ????????: [email or web address]  Primary care doctor follow up We booked an appointment with your primary care doctor, Dr. Gwendolyn Lima, to follow up on your health. She may cover your blood pressure and referral to dermatology.  ?????? ???? ??????? ??????? ???? ???? ???? ?? ???? ??????? ??????? ??????? ????? ??????? ????? ??????. ?????? ????? ??? ??? ??????? ??? ??? ??????? ???????.    ???? ????? ???? ??????? ?????? ?? ?? ?? ????!  ??? ???? ???? ??? ????? ?? ?????????? ??? ????? ?? ??????? ??? ??? ?????? ?? ??? ????? MyChart.  Please bring all of your medications to every appointment!  If you have any questions or concerns, please do not hesitate to contact us via phone or MyChart message.   Ezequiel Essex, MD

## 2022-08-30 NOTE — Assessment & Plan Note (Addendum)
Biopsy proven psoriasis. Previously prescribed methotrexate, but did not start it.  Was referred to dermatology in July 2023, but never got a call/reached out.  Also prescribed clobetasol 0.05% at that time, which he used until it ran out.  Did not work.  Today, patient wished to focus on N-[id number] before worrying about dermatology referrals.  Will follow up at next appointment.

## 2022-10-10 ENCOUNTER — Encounter: Payer: Self-pay | Admitting: Family Medicine

## 2022-10-10 ENCOUNTER — Ambulatory Visit (INDEPENDENT_AMBULATORY_CARE_PROVIDER_SITE_OTHER): Payer: 59 | Admitting: Family Medicine

## 2022-10-10 VITALS — BP 128/62 | HR 76 | Ht 71.0 in | Wt 196.0 lb

## 2022-10-10 DIAGNOSIS — E785 Hyperlipidemia, unspecified: Secondary | ICD-10-CM

## 2022-10-10 DIAGNOSIS — I10 Essential (primary) hypertension: Secondary | ICD-10-CM | POA: Diagnosis not present

## 2022-10-10 DIAGNOSIS — Z1211 Encounter for screening for malignant neoplasm of colon: Secondary | ICD-10-CM

## 2022-10-10 DIAGNOSIS — Z716 Tobacco abuse counseling: Secondary | ICD-10-CM | POA: Diagnosis not present

## 2022-10-10 DIAGNOSIS — Z720 Tobacco use: Secondary | ICD-10-CM

## 2022-10-10 DIAGNOSIS — L308 Other specified dermatitis: Secondary | ICD-10-CM | POA: Diagnosis not present

## 2022-10-10 DIAGNOSIS — L409 Psoriasis, unspecified: Secondary | ICD-10-CM

## 2022-10-10 MED ORDER — VARENICLINE TARTRATE (STARTER) 0.5 MG X 11 & 1 MG X 42 PO TBPK: ORAL_TABLET | ORAL | 1 refills | Status: DC

## 2022-10-10 MED ORDER — NICOTINE 7 MG/24HR TD PT24: 7.0000 mg | MEDICATED_PATCH | Freq: Every day | TRANSDERMAL | 2 refills | Status: DC

## 2022-10-10 MED ORDER — AMLODIPINE BESYLATE 5 MG PO TABS: 5.0000 mg | ORAL_TABLET | Freq: Every day | ORAL | 3 refills | Status: DC

## 2022-10-10 MED ORDER — ATORVASTATIN CALCIUM 40 MG PO TABS: 40.0000 mg | ORAL_TABLET | Freq: Every day | ORAL | 3 refills | Status: DC

## 2022-10-10 MED ORDER — CLOBETASOL PROPIONATE 0.05 % EX OINT: 1.0000 | TOPICAL_OINTMENT | Freq: Two times a day (BID) | CUTANEOUS | 2 refills | Status: DC

## 2022-10-10 NOTE — Progress Notes (Unsigned)
    SUBJECTIVE:   CHIEF COMPLAINT / HPI:   Psoriasis  Patient reports he never received call from dermatology office. Phone number in chart is correct. Used clobetasol until it ran out but it did not help according to patient. Denies any change in his symptoms.   HTN  Has been taking medication daily. Denies any side effects.   Smoking  Smokes 7 cigarettes a day. Says he never started chantix or the nicotine patches as he had an issue with his insurance. Thus, he did not pick up the medication from the pharmacy. He is still interested in smoking cessation and wants to start the chantix and nicotine patches.   PERTINENT  PMH / PSH: Psoriasis, MCI, HTN  AID # [id number]   OBJECTIVE:   BP 128/62   Pulse 76   Ht '5\' 11"'$  (1.803 m)   Wt 196 lb (88.9 kg)   SpO2 100%   BMI 27.34 kg/m   General: well appearing, in no acute distress CV: RRR, radial pulses equal and palpable, no BLE edema  Resp: Normal work of breathing on room air, CTAB Abd: Soft, non tender, non distended  Neuro: Alert & Oriented x 4  Derm: scalp improved since prior, few dispersed grayish plaques with scale  ASSESSMENT/PLAN:   Psoriasis Patient continues to have uncontrolled psoriasis, though not in as extensive of flare compared to last visit with me.  - Offered patient number to call as referral was placed last visit   Primary hypertension Well controlled with current regimen.  - Continue amlodipine 5 mg  - BMP today   Tobacco use Patient continues to be in contemplative phase, but has not initiated steps towards cessation.  - Re sent patches and chantix to pharmacy  - F/u in one month  - Consider appt with Dr. Valentina Lucks for smoking cessation     Health Maintenance  Tdap - asks to do next visit  Colon cancer screening - declined colonoscopy, but accepted cologuard   Lowry Ram, MD [place] [facility]

## 2022-10-10 NOTE — Patient Instructions (Addendum)
It was wonderful to see you today.  Please bring ALL of your medications with you to every visit.   Today we talked about:  Dermatology - Please call this clinic to make appointment. They already have the referral   [place] [phone], [place] [address] [phone]  Blood pressure - Your blood pressure looks good! Keep taking your medicine   Smoking - I re-prescribed your patches, and you should take Chantix the medicine for smoking.   Please follow up in 2 months   Thank you for choosing [facility].   Please call [phone] with any questions about today's appointment.  Please be sure to schedule follow up at the front desk before you leave today.   Lowry Ram, MD  Family Medicine    ??????? ??????? - ???? ??????? ???? ??????? ?????? ????. ????? ?????? ???????   ?????? ???? ??? ????? ??????? ??????? ??????? ?????? - ??????? ????? ???? ??? 3903 ? ???? ????? ?????????? ???? ????????? [id number] [phone]

## 2022-10-11 LAB — BASIC METABOLIC PANEL
BUN/Creatinine Ratio: 13 (ref 10–24)
BUN: 11 mg/dL (ref 8–27)
CO2: 21 mmol/L (ref 20–29)
Calcium: 9.3 mg/dL (ref 8.6–10.2)
Chloride: 107 mmol/L — ABNORMAL HIGH (ref 96–106)
Creatinine, Ser: 0.86 mg/dL (ref 0.76–1.27)
Glucose: 123 mg/dL — ABNORMAL HIGH (ref 70–99)
Potassium: 3.6 mmol/L (ref 3.5–5.2)
Sodium: 143 mmol/L (ref 134–144)
eGFR: 98 mL/min/{1.73_m2} (ref >59)

## 2022-10-11 LAB — LIPID PANEL
Chol/HDL Ratio: 2.6 ratio (ref 0.0–5.0)
Cholesterol, Total: 125 mg/dL (ref 100–199)
HDL: 48 mg/dL (ref >39)
LDL Chol Calc (NIH): 54 mg/dL (ref 0–99)
Triglycerides: 129 mg/dL (ref 0–149)
VLDL Cholesterol Cal: 23 mg/dL (ref 5–40)

## 2022-10-13 NOTE — Assessment & Plan Note (Signed)
Well controlled with current regimen.  - Continue amlodipine 5 mg  - BMP today

## 2022-10-13 NOTE — Assessment & Plan Note (Signed)
Patient continues to be in contemplative phase, but has not initiated steps towards cessation.  - Re sent patches and chantix to pharmacy  - F/u in one month  - Consider appt with Dr. Valentina Lucks for smoking cessation

## 2022-10-13 NOTE — Assessment & Plan Note (Signed)
Patient continues to have uncontrolled psoriasis, though not in as extensive of flare compared to last visit with me.  - Offered patient number to call as referral was placed last visit

## 2022-10-18 ENCOUNTER — Encounter: Payer: Self-pay | Admitting: Family Medicine

## 2022-11-01 ENCOUNTER — Telehealth: Payer: Self-pay | Admitting: Family Medicine

## 2022-11-01 NOTE — Telephone Encounter (Signed)
Patient came in asking if his doctor could please give him a call. He has some questions about testing he had done

## 2022-11-03 ENCOUNTER — Telehealth: Payer: Self-pay | Admitting: Family Medicine

## 2022-11-03 NOTE — Telephone Encounter (Signed)
Spoke with patient while Dr. Gwendolyn Lima is out of office. Patient was wondering where to take his Cologuard sample after collection. Advised patient to complete steps outline in video (which he confirmed he watched) and then take the box to any [employer] store for shipping. Patient understood and will contact us again if any problems arise.

## 2022-11-14 LAB — COLOGUARD: COLOGUARD: NEGATIVE

## 2023-01-23 ENCOUNTER — Encounter: Payer: Self-pay | Admitting: Family Medicine

## 2023-01-23 ENCOUNTER — Ambulatory Visit (INDEPENDENT_AMBULATORY_CARE_PROVIDER_SITE_OTHER): Payer: 59 | Admitting: Family Medicine

## 2023-01-23 VITALS — BP 112/72 | HR 78 | Wt 193.0 lb

## 2023-01-23 DIAGNOSIS — L409 Psoriasis, unspecified: Secondary | ICD-10-CM

## 2023-01-23 DIAGNOSIS — R7309 Other abnormal glucose: Secondary | ICD-10-CM

## 2023-01-23 DIAGNOSIS — Z720 Tobacco use: Secondary | ICD-10-CM | POA: Diagnosis not present

## 2023-01-23 DIAGNOSIS — R7303 Prediabetes: Secondary | ICD-10-CM | POA: Insufficient documentation

## 2023-01-23 DIAGNOSIS — R040 Epistaxis: Secondary | ICD-10-CM | POA: Diagnosis not present

## 2023-01-23 DIAGNOSIS — F17209 Nicotine dependence, unspecified, with unspecified nicotine-induced disorders: Secondary | ICD-10-CM

## 2023-01-23 DIAGNOSIS — Z716 Tobacco abuse counseling: Secondary | ICD-10-CM

## 2023-01-23 DIAGNOSIS — R059 Cough, unspecified: Secondary | ICD-10-CM | POA: Insufficient documentation

## 2023-01-23 DIAGNOSIS — I10 Essential (primary) hypertension: Secondary | ICD-10-CM

## 2023-01-23 DIAGNOSIS — R052 Subacute cough: Secondary | ICD-10-CM

## 2023-01-23 LAB — POCT GLYCOSYLATED HEMOGLOBIN (HGB A1C): Hemoglobin A1C: 5.9 % — AB (ref 4.0–5.6)

## 2023-01-23 MED ORDER — SALINE SPRAY 0.65 % NA SOLN: 1.0000 | NASAL | 0 refills | Status: DC | PRN

## 2023-01-23 MED ORDER — METFORMIN HCL ER 500 MG PO TB24: 500.0000 mg | ORAL_TABLET | Freq: Every day | ORAL | 0 refills | Status: DC

## 2023-01-23 MED ORDER — NICOTINE POLACRILEX 2 MG MT GUM: 2.0000 mg | CHEWING_GUM | OROMUCOSAL | 1 refills | Status: DC | PRN

## 2023-01-23 NOTE — Patient Instructions (Addendum)
[facility] General Dermatology - [facility] [phone], [place] [address] [phone]  Medicines  Metformin  Saline nasal spray  Nicotine gum

## 2023-01-23 NOTE — Assessment & Plan Note (Signed)
Most likely due to manipulation and dry nares.  - saline spray  - counseling regarding forward lean and pinching the nose when having a nose bleed

## 2023-01-23 NOTE — Assessment & Plan Note (Signed)
Still in contemplative phase.  - try Nicotine gum  - Follow up in one month

## 2023-01-23 NOTE — Progress Notes (Signed)
SUBJECTIVE:   CHIEF COMPLAINT / HPI:   Tobacco use  Patient states that he still smokes 5 to 10 cigarettes a day.  He uses this for stress control.  He has a lot of stress due to his family still being interested on especially with new water that is started.  He did not start the Chantix that was previously prescribed.  When he went to the pharmacy said that this was not available for pickup.  However he would be more amenable to gum.  Says that he is still motivated to try to quit.  Hypertension  Patient denies any side effects from medication.  Psoriasis  Patient says that this is stable at this point.  He says that he went in person to the dermatology office to make an appointment to given that they were having difficulty communicating with the patient to schedule an appointment.  Patient's number is correct on the chart.  He says that he was never called.  Upon visiting the office was told that he would receive a call from the office schedule the appointment.  Cough  Patient had a cough for a week and had some dyspnea on exertion but he says this is resolving and he feels better.   Nosebleeds Patient says that this is a chronic problem that he deals with occasionally.  Says that whenever he gets a cough or seasons change she gets nosebleeds.  Says that he tips his head back and blows his nose aggressively to stop the bleeding.  Has not tried anything for this.  PERTINENT  PMH / PSH: HTN, Psoriasis, Tobacco use   OBJECTIVE:   BP 112/72   Pulse 78   Wt 193 lb (87.5 kg)   SpO2 97%   BMI 26.92 kg/m   General: well appearing, in no acute distress HEENT: Erythematous turbinates, no edema or dried blood visible  CV: RRR, radial pulses equal and palpable, no BLE edema  Resp: Normal work of breathing on room air, CTAB, no prolonged expiratory phase  Abd: Soft, non tender, non distended  Neuro: Alert & Oriented x 4    ASSESSMENT/PLAN:   Bleeding from the nose Assessment &  Plan: Most likely due to manipulation and dry nares.  - saline spray  - counseling regarding forward lean and pinching the nose when having a nose bleed   Orders: -     Saline Spray; Place 1 spray into both nostrils as needed for congestion. Or for dry nasal passages, nose bleeds  Dispense: 37.5 mL; Refill: 0  Tobacco use Assessment & Plan: Still in contemplative phase.  - try Nicotine gum  - Follow up in one month   Orders: -     Nicotine Polacrilex; Take 1 each (2 mg total) by mouth as needed for smoking cessation.  Dispense: 100 tablet; Refill: 1  Prediabetes Assessment & Plan: Given patient has some evidence of metabolic syndrome (HLD, HTN, overweight) and patient is aware of risks and benefits, patient has elected to start metformin at this time.  - counseled regarding lifestyle interventions  - metformin 500 daily  - Recehck A1c in 3 months   Orders: -     metFORMIN HCl ER; Take 1 tablet (500 mg total) by mouth daily with breakfast.  Dispense: 180 tablet; Refill: 0  Psoriasis Assessment & Plan: Offered number for dermatology office. If patient is not able to make appointment, will call office for patient and make appointment.   Subacute cough Assessment & Plan:  Most likely due to a viral syndrome. Given that it is improving will just continue to monitor. No red flag signs, no hematemesis or hemoptysis, No fever, or shortness of breath.    Primary hypertension Assessment & Plan: Well controlled on current regimen.  - Continue amlodipine   Lockie Mola, MD [facility]

## 2023-01-23 NOTE — Assessment & Plan Note (Signed)
Offered number for dermatology office. If patient is not able to make appointment, will call office for patient and make appointment.

## 2023-01-23 NOTE — Assessment & Plan Note (Signed)
Well controlled on current regimen.  - Continue amlodipine

## 2023-01-23 NOTE — Assessment & Plan Note (Signed)
Most likely due to a viral syndrome. Given that it is improving will just continue to monitor. No red flag signs, no hematemesis or hemoptysis, No fever, or shortness of breath.

## 2023-01-23 NOTE — Assessment & Plan Note (Signed)
Given patient has some evidence of metabolic syndrome (HLD, HTN, overweight) and patient is aware of risks and benefits, patient has elected to start metformin at this time.  - counseled regarding lifestyle interventions  - metformin 500 daily  - Recehck A1c in 3 months

## 2023-02-20 ENCOUNTER — Ambulatory Visit: Payer: 59 | Admitting: Family Medicine

## 2023-02-20 ENCOUNTER — Other Ambulatory Visit: Payer: Self-pay

## 2023-02-20 ENCOUNTER — Encounter: Payer: Self-pay | Admitting: Family Medicine

## 2023-02-20 VITALS — BP 110/87 | HR 62 | Ht 71.0 in | Wt 195.8 lb

## 2023-02-20 DIAGNOSIS — L409 Psoriasis, unspecified: Secondary | ICD-10-CM

## 2023-02-20 DIAGNOSIS — Z720 Tobacco use: Secondary | ICD-10-CM | POA: Diagnosis not present

## 2023-02-20 MED ORDER — NICOTINE POLACRILEX 2 MG MT GUM
2.0000 mg | CHEWING_GUM | OROMUCOSAL | 1 refills | Status: DC | PRN
Start: 2023-02-20 — End: 2023-06-13

## 2023-02-20 NOTE — Assessment & Plan Note (Signed)
Called dermatology office and patient is scheduled in 10/2023.  Stable as of now.

## 2023-02-20 NOTE — Progress Notes (Signed)
    SUBJECTIVE:   CHIEF COMPLAINT / HPI:   Tobacco Use  Patient went from smoking one pack a day to 5 cigarettes a day. Has not been taking chantix. He says the stress of being a foreigner and work keep him smoking the 5 cigarettes - to keep him busy. He has been taking evening walks He is motivated to continue not smoking by the time we meet next.   Citizenship/ [id number]  Patient reports he needs an appointment to get citizenship and would like help with citizenship. He has given [place] his AID number and has been in the country for at least a year.   PERTINENT  PMH / PSH: HTN, tobacco use, refugee, HLD   OBJECTIVE:   BP 110/87   Pulse 62   Ht 5\' 11"  (1.803 m)   Wt 195 lb 12.8 oz (88.8 kg)   SpO2 100%   BMI 27.31 kg/m   General: well appearing, in no acute distress CV: RRR, radial pulses equal and palpable, no BLE edema  Resp: Normal work of breathing on room air, CTAB Abd: Soft, non tender, non distended  Neuro: Alert & Oriented x 4    ASSESSMENT/PLAN:   Psoriasis Assessment & Plan: Called dermatology office and patient is scheduled in 10/2023.  Stable as of now.    Tobacco use Assessment & Plan: Patient has greatly improved tobacco use without chantix.  Continue gum very helpful for patient. - Refill nicotine gum - Follow-up in 1 month  Orders: -     Nicotine Polacrilex; Take 1 each (2 mg total) by mouth as needed for smoking cessation.  Dispense: 100 tablet; Refill: 1   Messaged Dr. Miquel Dunn, Dr. Manson Passey, Doristine Counter  to schedule patient for refugee clinic   Lockie Mola, MD [facility]

## 2023-02-20 NOTE — Assessment & Plan Note (Signed)
Patient has greatly improved tobacco use without chantix.  Continue gum very helpful for patient. - Refill nicotine gum - Follow-up in 1 month

## 2023-03-04 ENCOUNTER — Other Ambulatory Visit: Payer: Self-pay | Admitting: Family Medicine

## 2023-03-04 DIAGNOSIS — E785 Hyperlipidemia, unspecified: Secondary | ICD-10-CM

## 2023-04-14 ENCOUNTER — Encounter: Payer: Self-pay | Admitting: Family Medicine

## 2023-04-14 ENCOUNTER — Other Ambulatory Visit: Payer: Self-pay

## 2023-04-14 ENCOUNTER — Ambulatory Visit: Payer: 59 | Admitting: Family Medicine

## 2023-04-14 VITALS — BP 121/79 | HR 72 | Ht 71.0 in | Wt 198.4 lb

## 2023-04-14 DIAGNOSIS — Z716 Tobacco abuse counseling: Secondary | ICD-10-CM | POA: Diagnosis not present

## 2023-04-14 DIAGNOSIS — Z23 Encounter for immunization: Secondary | ICD-10-CM

## 2023-04-14 DIAGNOSIS — I10 Essential (primary) hypertension: Secondary | ICD-10-CM

## 2023-04-14 DIAGNOSIS — Z0289 Encounter for other administrative examinations: Secondary | ICD-10-CM

## 2023-04-14 DIAGNOSIS — R011 Cardiac murmur, unspecified: Secondary | ICD-10-CM

## 2023-04-14 NOTE — Progress Notes (Unsigned)
    SUBJECTIVE:   CHIEF COMPLAINT / HPI:   HTN Patient reports no side effects to medications and reports compliance.   Refugee  Patient interested in assistance with [phone] form for citizenship.   Tobacco Cessation  Patient reports that tobacco cessation efforts are going well. Says that he is mainly using the nicotine lozenges and that they have helped so much that patient does not even need to carry a pack of cigarettes with him anymore. Patient also says that he has been able to find a community and make some friends now which has also helped.   PERTINENT  PMH / PSH: HTN, Refugee status, Tobacco use disorder   OBJECTIVE:   BP 121/79   Pulse 72   Ht 5\' 11"  (1.803 m)   Wt 198 lb 6.4 oz (90 kg)   SpO2 100%   BMI 27.67 kg/m   General: well appearing, in no acute distress CV: right side of sternal border, systolic crescendo murmur II/VI, otherwise regular rate and rhythm, No BLE edema  Resp: Normal work of breathing on room air, CTAB Abd: Soft, non tender, non distended  Neuro: Alert & Oriented    ASSESSMENT/PLAN:   . Assessment & Plan Systolic murmur Patient asymptomatic without vital sign changes or signs of heart failure. However, systolic murmur is new. With patient's age and co morbidities concern for new cardiac disease. Could also be in the setting of anemia. Cologuard negative, patient denies rectal blood.  - ordered echo  - CBC, BMP  Encounter for immunization Received flu vaccine  Primary hypertension Well controlled  - Continue amlodipine  Encounter for tobacco use cessation counseling Much improved  - patient has adequate lozenges  - Follow up in one month  Refugee health examination Will complete paperwork and schedule patient for N648 exam.       Lockie Mola, MD [facility]

## 2023-04-14 NOTE — Patient Instructions (Addendum)
?????. ??? ???? ???? ?? ??????? ?? ?????? ??????! ???? ??? ??? ?????? ?????.   ???? ??? ????? ?????? ???? ???? ?????? ?????? ??? ???????? ??? ??????? ????? ??   19 ?????? ?????? 2 ?????.   ???? ????? ????? ?? ?????? ?????? ?????? ???? ?? ???? ???????? ?????? ??? ????? N648.  ???? ?????? ??? ??? ?? ??? ??? ??? ??? ?????.

## 2023-04-15 LAB — CBC WITH DIFFERENTIAL/PLATELET
Basophils Absolute: 0 10*3/uL (ref 0.0–0.2)
Basos: 1 %
EOS (ABSOLUTE): 0.3 10*3/uL (ref 0.0–0.4)
Eos: 5 %
Hematocrit: 44.2 % (ref 37.5–51.0)
Hemoglobin: 14.9 g/dL (ref 13.0–17.7)
Immature Grans (Abs): 0 10*3/uL (ref 0.0–0.1)
Immature Granulocytes: 0 %
Lymphocytes Absolute: 2.1 10*3/uL (ref 0.7–3.1)
Lymphs: 35 %
MCH: 31.2 pg (ref 26.6–33.0)
MCHC: 33.7 g/dL (ref 31.5–35.7)
MCV: 93 fL (ref 79–97)
Monocytes Absolute: 0.7 10*3/uL (ref 0.1–0.9)
Monocytes: 11 %
Neutrophils Absolute: 3 10*3/uL (ref 1.4–7.0)
Neutrophils: 48 %
Platelets: 230 10*3/uL (ref 150–450)
RBC: 4.78 x10E6/uL (ref 4.14–5.80)
RDW: 11.9 % (ref 11.6–15.4)
WBC: 6.2 10*3/uL (ref 3.4–10.8)

## 2023-04-15 NOTE — Assessment & Plan Note (Signed)
Well controlled. Continue amlodipine 

## 2023-04-19 ENCOUNTER — Encounter: Payer: Self-pay | Admitting: Family Medicine

## 2023-04-27 ENCOUNTER — Ambulatory Visit (HOSPITAL_COMMUNITY)
Admission: RE | Admit: 2023-04-27 | Discharge: 2023-04-27 | Disposition: A | Discharge status: Home / Self Care | Payer: 59 | Source: Ambulatory Visit | Attending: Family Medicine | Admitting: Family Medicine

## 2023-04-27 DIAGNOSIS — R011 Cardiac murmur, unspecified: Secondary | ICD-10-CM | POA: Diagnosis not present

## 2023-04-27 DIAGNOSIS — I77810 Thoracic aortic ectasia: Secondary | ICD-10-CM | POA: Diagnosis not present

## 2023-04-27 DIAGNOSIS — F17200 Nicotine dependence, unspecified, uncomplicated: Secondary | ICD-10-CM | POA: Insufficient documentation

## 2023-04-27 DIAGNOSIS — I10 Essential (primary) hypertension: Secondary | ICD-10-CM | POA: Insufficient documentation

## 2023-04-27 DIAGNOSIS — R4182 Altered mental status, unspecified: Secondary | ICD-10-CM | POA: Diagnosis present

## 2023-04-27 LAB — ECHOCARDIOGRAM COMPLETE
Area-P 1/2: 3.31 cm2
Calc EF: 59.7 %
S' Lateral: 3.05 cm
Single Plane A2C EF: 65.5 %
Single Plane A4C EF: 56.9 %

## 2023-04-27 NOTE — Progress Notes (Signed)
  Echocardiogram 2D Echocardiogram has been performed.  Janalyn Harder 04/27/2023, 2:52 PM

## 2023-04-30 DIAGNOSIS — I77810 Thoracic aortic ectasia: Secondary | ICD-10-CM | POA: Insufficient documentation

## 2023-04-30 NOTE — Assessment & Plan Note (Signed)
Mild aortic root dilation to 40 mm. Will follow up in 1 year and aggressively treat HTN.

## 2023-05-04 ENCOUNTER — Telehealth: Payer: Self-pay | Admitting: Family Medicine

## 2023-05-04 DIAGNOSIS — I77810 Thoracic aortic ectasia: Secondary | ICD-10-CM

## 2023-05-04 NOTE — Telephone Encounter (Signed)
Called patient with interpreter to inform of results of echocardiogram.  Explained to patient that his aorta was slightly widened and that this would need to be monitored.  I explained that the danger is that it would continue to widen and could cause problems.  Told him that at this time the most important thing would be controlling his blood pressure which she is doing a good job of.  I told him that referring him to GI so that they know.  Follow-up of this aortic root diet and.  Whether he would be a 35-month echo or CT.  Patient denies any current chest pain, shortness of breath, palpitations.  He said he would look out for a call for scheduling the cardiology appointment.  Denied any further questions I scheduled an appointment for the patient with myself on November 25 to ensure that this does not get lost.

## 2023-06-01 NOTE — Progress Notes (Signed)
Cardiology Office Note:    Date:  06/13/2023   ID:  Jared Porter, DOB 10-07-1960, MRN [id number]  PCP:  Lockie Mola, MD   [facility] Providers Cardiologist:  None     Referring MD: Westley Chandler, MD   Chief Complaint  Patient presents with   Chest Pain    Pt stated when he works he gets chest pain and light headed and SOB   Shortness of Breath    History of Present Illness:    Jared Porter is a 62 y.o. male seen at the request of Dr Manson Passey for evaluation of aortic root enlargement noted on Echo. History of HTN, HLD, pre diabetes, and  tobacco use. Echo done to evaluate a murmur. No significant valvular pathology seen.   The patient reports that for the past year he gets chest pain and pressure with exertion and SOB. Resolves with rest. Has been stable. He is smoking 5 cigs/day now. No prior cardiac history. No family history of CAD or aneurysms.   Past Medical History:  Diagnosis Date   High cholesterol    Hypertension    Prediabetes    Seasonal allergies    Tobacco use     Past Surgical History:  Procedure Laterality Date   DENTAL SURGERY      Current Medications: Current Meds  Medication Sig   amLODipine (NORVASC) 5 MG tablet TAKE 1 TABLET BY MOUTH AT BEDTIME   atorvastatin (LIPITOR) 40 MG tablet Take 1 tablet (40 mg total) by mouth daily.     Allergies:   Patient has no known allergies.   Social History   Socioeconomic History   Marital status: Single    Spouse name: Not on file   Number of children: 3   Years of education: 8   Highest education level: Not on file  Occupational History    Comment: [employer]  Tobacco Use   Smoking status: Every Day    Current packs/day: 0.50    Types: Cigarettes    Passive exposure: Current   Smokeless tobacco: Never   Tobacco comments:    Pt states that he smokes 5 cig a day  Vaping Use   Vaping status: Never Used  Substance and Sexual Activity   Alcohol use: Never   Drug use:  Never   Sexual activity: Not on file  Other Topics Concern   Not on file  Social History Narrative   Lives alone.    Social Determinants of Health   Financial Resource Strain: Not on file  Food Insecurity: Not on file  Transportation Needs: Not on file  Physical Activity: Not on file  Stress: Not on file  Social Connections: Not on file     Family History: The patient's family history includes Hypertension in his sister. There is no history of Heart disease.  ROS:   Please see the history of present illness.     All other systems reviewed and are negative.  EKGs/Labs/Other Studies Reviewed:    The following studies were reviewed today:  EKG Interpretation Date/Time:  Tuesday June 13 2023 10:53:40 EST Ventricular Rate:  91 PR Interval:  166 QRS Duration:  92 QT Interval:  352 QTC Calculation: 432 R Axis:   42  Text Interpretation: Normal sinus rhythm Nonspecific T wave abnormality No previous ECGs available Confirmed by [place], Jameal Razzano [phone]) on 06/13/2023 10:55:44 AM   Echo 04/27/23: IMPRESSIONS     1. Left ventricular ejection fraction, by estimation, is 55 to 60%. The  left ventricle has normal function. The left ventricle has no regional  wall motion abnormalities. There is mild concentric left ventricular  hypertrophy. Left ventricular diastolic  parameters are consistent with Grade I diastolic dysfunction (impaired  relaxation). The average left ventricular global longitudinal strain is  -18.4 %. The global longitudinal strain is normal.   2. Right ventricular systolic function is normal. The right ventricular  size is normal. There is normal pulmonary artery systolic pressure.   3. The mitral valve is normal in structure. Trivial mitral valve  regurgitation. No evidence of mitral stenosis.   4. The aortic valve is tricuspid. Aortic valve regurgitation is not  visualized. No aortic stenosis is present.   5. Aortic dilatation noted. There is mild dilatation  of the aortic root,  measuring 40 mm.   6. The inferior vena cava is normal in size with greater than 50%  respiratory variability, suggesting right atrial pressure of 3 mmHg.   EKG Interpretation Date/Time:  Tuesday June 13 2023 10:53:40 EST Ventricular Rate:  91 PR Interval:  166 QRS Duration:  92 QT Interval:  352 QTC Calculation: 432 R Axis:   42  Text Interpretation: Normal sinus rhythm Nonspecific T wave abnormality No previous ECGs available Confirmed by [place], Braxon Suder [phone]) on 06/13/2023 10:55:44 AM    Recent Labs: 10/10/2022: BUN 11; Creatinine, Ser 0.86; Potassium 3.6; Sodium 143 04/14/2023: Hemoglobin 14.9; Platelets 230  Recent Lipid Panel    Component Value Date/Time   CHOL 125 10/10/2022 1218   TRIG 129 10/10/2022 1218   HDL 48 10/10/2022 1218   CHOLHDL 2.6 10/10/2022 1218   LDLCALC 54 10/10/2022 1218     Risk Assessment/Calculations:                Physical Exam:    VS:  BP 120/72 (BP Location: Left Arm, Patient Position: Sitting, Cuff Size: Normal)   Pulse 91   Ht 5\' 11"  (1.803 m)   Wt 213 lb (96.6 kg)   SpO2 97%   BMI 29.71 kg/m     Wt Readings from Last 3 Encounters:  06/13/23 213 lb (96.6 kg)  04/14/23 198 lb 6.4 oz (90 kg)  02/20/23 195 lb 12.8 oz (88.8 kg)     GEN:  Well nourished, well developed in no acute distress HEENT: Normal NECK: No JVD; No carotid bruits LYMPHATICS: No lymphadenopathy CARDIAC: RRR, no murmurs, rubs, gallops RESPIRATORY:  Clear to auscultation without rales, wheezing or rhonchi  ABDOMEN: Soft, non-tender, non-distended MUSCULOSKELETAL:  No edema; No deformity  SKIN: Warm and dry, he has diffuse rash on trunk with scaly patches surrounding darker pigmented areas.  NEUROLOGIC:  Alert and oriented x 3 PSYCHIATRIC:  Normal affect   ASSESSMENT:    1. Aortic root dilation (HCC)   2. DOE (dyspnea on exertion)   3. Stable angina pectoris (HCC)    PLAN:    In order of problems listed above:  Exertional  chest pain and dyspnea. Multiple CV risk factors. Recommend coronary CTA to evaluate. We can also measure aortic root on this exam. Will arrange follow up with APP after test HTN controlled on amlodipine Tobacco abuse encourage complete smoking cessation HLD on statin. LDL excellent at 54.  Chronic rash. Recommend follow up with PCP           Medication Adjustments/Labs and Tests Ordered: Current medicines are reviewed at length with the patient today.  Concerns regarding medicines are outlined above.  Orders Placed This Encounter  Procedures   EKG  12-Lead   No orders of the defined types were placed in this encounter.   There are no Patient Instructions on file for this visit.   Signed, Margreat Widener [place], MD  06/13/2023 11:15 AM    [facility]

## 2023-06-13 ENCOUNTER — Other Ambulatory Visit: Payer: Self-pay | Admitting: Cardiology

## 2023-06-13 ENCOUNTER — Ambulatory Visit: Payer: 59 | Attending: Cardiology | Admitting: Cardiology

## 2023-06-13 ENCOUNTER — Encounter: Payer: Self-pay | Admitting: Cardiology

## 2023-06-13 VITALS — BP 120/72 | HR 91 | Ht 71.0 in | Wt 213.0 lb

## 2023-06-13 DIAGNOSIS — I2089 Other forms of angina pectoris: Secondary | ICD-10-CM

## 2023-06-13 DIAGNOSIS — I77810 Thoracic aortic ectasia: Secondary | ICD-10-CM

## 2023-06-13 DIAGNOSIS — R0609 Other forms of dyspnea: Secondary | ICD-10-CM

## 2023-06-13 MED ORDER — METOPROLOL TARTRATE 100 MG PO TABS: 50.0000 mg | ORAL_TABLET | Freq: Two times a day (BID) | ORAL | 0 refills | Status: DC

## 2023-06-13 NOTE — Patient Instructions (Addendum)
Medication Instructions:  Metoprolol tartrate 100 mg 2 hours before CT  *If you need a refill on your cardiac medications before your next appointment, please call your pharmacy*   Lab Work: BMET If you have labs (blood work) drawn today and your tests are completely normal, you will receive your results only by: MyChart Message (if you have MyChart) OR A paper copy in the mail If you have any lab test that is abnormal or we need to change your treatment, we will call you to review the results.   Testing/Procedures: Coronary CT    Follow-Up: At [facility], you and your health needs are our priority.  As part of our continuing mission to provide you with exceptional heart care, we have created designated Provider Care Teams.  These Care Teams include your primary Cardiologist (physician) and Advanced Practice Providers (APPs -  Physician Assistants and Nurse Practitioners) who all work together to provide you with the care you need, when you need it.  We recommend signing up for the patient portal called "MyChart".  Sign up information is provided on this After Visit Summary.  MyChart is used to connect with patients for Virtual Visits (Telemedicine).  Patients are able to view lab/test results, encounter notes, upcoming appointments, etc.  Non-urgent messages can be sent to your provider as well.   To learn more about what you can do with MyChart, go to [email or web address].    Your next appointment:   Follow up in 6 weeks with a NP after CT   Provider:   Dr.Jordan   Other Instructions   Your cardiac CT will be scheduled at one of the below locations:   [facility] [address] [place], [place] [address] [phone]  OR  [facility] [address] [place], [place] [address] [phone]  OR   [facility] [address] [place], [place] [address] [phone]  If  scheduled at [facility], please arrive at the [facility] and Children's Entrance (Entrance C2) of [facility] 30 minutes prior to test start time. You can use the FREE valet parking offered at entrance C (encouraged to control the heart rate for the test)  Proceed to the [facility] Radiology Department (first floor) to check-in and test prep.  All radiology patients and guests should use entrance C2 at [facility], accessed from [facility], even though the hospital's physical address listed is [address]    If scheduled at [facility] or [facility], please arrive 15 mins early for check-in and test prep.  There is spacious parking and easy access to the radiology department from the [facility] Heart and Vascular entrance. Please enter here and check-in with the desk attendant.   Please follow these instructions carefully (unless otherwise directed):  An IV will be required for this test and Nitroglycerin will be given.  Hold all erectile dysfunction medications at least 3 days (72 hrs) prior to test. (Ie viagra, cialis, sildenafil, tadalafil, etc)   On the Night Before the Test: Be sure to Drink plenty of water. Do not consume any caffeinated/decaffeinated beverages or chocolate 12 hours prior to your test. Do not take any antihistamines 12 hours prior to your test.   On the Day of the Test: Drink plenty of water until 1 hour prior to the test. Do not eat any food 1 hour prior to test. You may take  your regular medications prior to the test.  Take metoprolol tartrate 100 mg two hours prior to test.         After the Test: Drink plenty of water. After receiving IV contrast, you may experience a mild flushed feeling. This is normal. On occasion, you may experience a mild rash up to 24 hours after the test. This is not dangerous. If this occurs, you can take Benadryl 25 mg and increase your fluid  intake. If you experience trouble breathing, this can be serious. If it is severe call 911 IMMEDIATELY. If it is mild, please call our office.   We will call to schedule your test 2-4 weeks out understanding that some insurance companies will need an authorization prior to the service being performed.   For more information and frequently asked questions, please visit our website : [email or web address]  For non-scheduling related questions, please contact the cardiac imaging nurse navigator should you have any questions/concerns: Cardiac Imaging Nurse Navigators Direct Office Dial: [phone]   For scheduling needs, including cancellations and rescheduling, please call [place], [phone].

## 2023-06-14 LAB — BASIC METABOLIC PANEL
BUN/Creatinine Ratio: 15 (ref 10–24)
BUN: 12 mg/dL (ref 8–27)
CO2: 23 mmol/L (ref 20–29)
Calcium: 9.6 mg/dL (ref 8.6–10.2)
Chloride: 106 mmol/L (ref 96–106)
Creatinine, Ser: 0.78 mg/dL (ref 0.76–1.27)
Glucose: 127 mg/dL — ABNORMAL HIGH (ref 70–99)
Potassium: 3.9 mmol/L (ref 3.5–5.2)
Sodium: 144 mmol/L (ref 134–144)
eGFR: 101 mL/min/{1.73_m2} (ref >59)

## 2023-06-19 ENCOUNTER — Ambulatory Visit (INDEPENDENT_AMBULATORY_CARE_PROVIDER_SITE_OTHER): Payer: Self-pay | Admitting: Family Medicine

## 2023-06-19 ENCOUNTER — Encounter: Payer: Self-pay | Admitting: Family Medicine

## 2023-06-19 VITALS — BP 117/80 | HR 82 | Wt 215.4 lb

## 2023-06-19 DIAGNOSIS — Z716 Tobacco abuse counseling: Secondary | ICD-10-CM

## 2023-06-19 DIAGNOSIS — R0602 Shortness of breath: Secondary | ICD-10-CM | POA: Insufficient documentation

## 2023-06-19 DIAGNOSIS — L409 Psoriasis, unspecified: Secondary | ICD-10-CM

## 2023-06-19 MED ORDER — TIOTROPIUM BROMIDE MONOHYDRATE 18 MCG IN CAPS: 18.0000 ug | ORAL_CAPSULE | Freq: Every day | RESPIRATORY_TRACT | 0 refills | Status: DC

## 2023-06-19 NOTE — Assessment & Plan Note (Signed)
Patient has some new plaques however they have been there for one month and are now stable not bothering him.  - Will wait to treat. Patient has derm appointment in March

## 2023-06-19 NOTE — Progress Notes (Signed)
    SUBJECTIVE:   CHIEF COMPLAINT / HPI:   Shortness of breath  Patient says that he sometimes still feels short of breath especially after walking a lot. Then, he feels some chest pressure as well. Does also endorse wheezing after a lot of walking. No chest pain at this moment. No shortness of breath at rest or palpitations. Is scheduled for   coronary cta on the 16th of November and has a follow up with cardiologist already scheduled.   Cigarettes  He is down to 3 cigarettes daily. He is still stressed about citizenship and says he will quit smoking when he has his passport.    PERTINENT  PMH / PSH: HTN, Psoriasis  OBJECTIVE:   BP 117/80   Pulse 82   Wt 215 lb 6.4 oz (97.7 kg)   SpO2 99%   BMI 30.04 kg/m   General: well appearing, in no acute distress CV: RRR, radial pulses equal and palpable, no BLE edema, cap refill < 2 seconds Resp: Normal work of breathing on room air, CTAB, no wheeze Abd: Soft, non tender, non distended  Skin: scaly gray/white plaques on palmar surfaces bilaterally (in media tab)    ASSESSMENT/PLAN:   Assessment & Plan Shortness of breath Concern for angina or CAD given HTN history. Currently following with cardiology. However, patient could also have COPD. Has not been able to get PFTs.  - Given wheezing with exertion will start spiriva  - F/u coronary CTA on 11/16 Encounter for tobacco use cessation counseling Counseled regarding quitting smoking  Is still using nicotine gum occasionally  Goal of quitting by new year  Psoriasis Patient has some new plaques however they have been there for one month and are now stable not bothering him.  - Will wait to treat. Patient has derm appointment in March      Lockie Mola, MD [facility]

## 2023-06-19 NOTE — Assessment & Plan Note (Signed)
Concern for angina or CAD given HTN history. Currently following with cardiology. However, patient could also have COPD. Has not been able to get PFTs.  - Given wheezing with exertion will start spiriva  - F/u coronary CTA on 11/16

## 2023-06-23 ENCOUNTER — Telehealth: Payer: Self-pay | Admitting: Family Medicine

## 2023-06-23 NOTE — Telephone Encounter (Signed)
Contacted by interpreter and patient's friend abotu [phone]. Dr. Marsh Dolly- it looks like you were going to complete the [place]648 screening process? Can you please reach out to the patient about this?  Thanks CB

## 2023-07-03 ENCOUNTER — Ambulatory Visit: Payer: Self-pay | Admitting: Family Medicine

## 2023-07-11 ENCOUNTER — Ambulatory Visit (INDEPENDENT_AMBULATORY_CARE_PROVIDER_SITE_OTHER): Payer: Self-pay | Admitting: Family Medicine

## 2023-07-11 ENCOUNTER — Encounter: Payer: Self-pay | Admitting: Family Medicine

## 2023-07-11 VITALS — BP 114/68 | HR 76 | Ht 71.0 in | Wt 223.0 lb

## 2023-07-11 DIAGNOSIS — R0602 Shortness of breath: Secondary | ICD-10-CM

## 2023-07-11 DIAGNOSIS — F17209 Nicotine dependence, unspecified, with unspecified nicotine-induced disorders: Secondary | ICD-10-CM

## 2023-07-11 NOTE — Patient Instructions (Signed)
It was so good to see you today please work on reducing your cigarette consumption. Let me know if you ever have shortness of breath again. You have a follow up appointment with the cardiologist.

## 2023-07-11 NOTE — Progress Notes (Signed)
    SUBJECTIVE:   CHIEF COMPLAINT / HPI:   Follow up SOB Patient feels much better than previously. Does not get short of breath at all now. No chest pain.   Follow up Tobacco cessation  Smokes 5 cigarettes a day. Says he will not be able to quit until after he gets citizenship.   Refugee status and [employer]  Has been called from the clinic to schedule refugee clinic on January 14th but patient did not answer the phone. Has completed [phone] exam previously but did not have his ID card at that time. Patient has [employer] and needs accomodations for the exam. Patient says he was unaware of any calls. He is going to his hometown for the first time in 7 years He would like to have the exam before this.    PERTINENT  PMH / PSH: HTN, psoriasis   OBJECTIVE:   BP 114/68   Pulse 76   Ht 5\' 11"  (1.803 m)   Wt 223 lb (101.2 kg)   SpO2 99%   BMI 31.10 kg/m   General: well appearing, in no acute distress CV: RRR, radial pulses equal and palpable, no BLE edema  Resp: Normal work of breathing on room air, CTAB Abd: Soft, non tender, non distended  Neuro: Alert & Oriented x 4    ASSESSMENT/PLAN:   Assessment & Plan Shortness of breath Now resolved. Unlikely cardiac in nature. Could have been a bronchitis or viral URI.  Still following with cardiology for evaluation of enlarged aortic root.  Tobacco use disorder, continuous Patient has been able to get to a minimal amount of cigarettes but continues to have stressors causing use.   Refugee appointment with Dr. Miquel Dunn in about 2 weeks.     Lockie Mola, MD [facility]

## 2023-07-14 NOTE — Assessment & Plan Note (Signed)
Now resolved. Unlikely cardiac in nature. Could have been a bronchitis or viral URI.  Still following with cardiology for evaluation of enlarged aortic root.

## 2023-07-19 NOTE — Progress Notes (Signed)
Cardiology Office Note:  .   Date:  07/24/2023  ID:  Jared Porter, DOB January 26, 1961, MRN [id number] PCP: Lockie Mola, MD  [facility] Providers Cardiologist:  Dr. Swaziland    History of Present Illness: .   Jared Porter is a 62 y.o. male with hx of aortic root enlargement noted on Echo, HTN, HLD, pre diabetes, and  tobacco use.  When seen by Dr. Swaziland on 06/13/2023 a cardiac CTA was ordered due to recurrent chest pain.  This gentleman is from [place] and requires interpreter for language barrier.  He states he continues to have discomfort in his chest when he lays on his stomach.  He does not have exertional chest pain with work, heavy lifting, walking, or running.  He denies any wheezing or coughing.  He has been medically compliant.  However he has not been using an inhaler as he was told it was for asthma and he does not feel that he has it.  He has been compliant with antihypertensive medications and lipid-lowering agents.  He did not have his coronary CTA completed because he had gone to the wrong place and missed his appointment.  This will need to be reordered.  ROS: As above otherwise negative  Studies Reviewed: .     Echo 04/27/23:  IMPRESSIONS   1. Left ventricular ejection fraction, by estimation, is 55 to 60%. The  left ventricle has normal function. The left ventricle has no regional  wall motion abnormalities. There is mild concentric left ventricular  hypertrophy. Left ventricular diastolic  parameters are consistent with Grade I diastolic dysfunction (impaired  relaxation). The average left ventricular global longitudinal strain is  -18.4 %. The global longitudinal strain is normal.   2. Right ventricular systolic function is normal. The right ventricular  size is normal. There is normal pulmonary artery systolic pressure.   3. The mitral valve is normal in structure. Trivial mitral valve  regurgitation. No evidence of mitral stenosis.   4. The  aortic valve is tricuspid. Aortic valve regurgitation is not  visualized. No aortic stenosis is present.   5. Aortic dilatation noted. There is mild dilatation of the aortic root,  measuring 40 mm.   6. The inferior vena cava is normal in size with greater than 50%  respiratory variability, suggesting right atrial pressure of 3 mmHg.     Physical Exam:   VS:  BP 106/72 (BP Location: Left Arm, Patient Position: Sitting, Cuff Size: Large)   Pulse 67   Ht 5\' 11"  (1.803 m)   Wt 224 lb 9.6 oz (101.9 kg)   SpO2 96%   BMI 31.33 kg/m    Wt Readings from Last 3 Encounters:  07/24/23 224 lb 9.6 oz (101.9 kg)  07/11/23 223 lb (101.2 kg)  06/19/23 215 lb 6.4 oz (97.7 kg)    GEN: Well nourished, well developed in no acute distress NECK: No JVD; No carotid bruits CARDIAC: RRR, no murmurs, rubs, gallops RESPIRATORY:  Clear to auscultation without rales, wheezing or rhonchi  ABDOMEN: Soft, non-tender, non-distended EXTREMITIES:  No edema; No deformity   ASSESSMENT AND PLAN: .    Chest pain: Appears atypical from cardiac pain.  The patient states he noticed it when he lays on his stomach but not on his back.  He denies it during exertional activities.  He does have cardiovascular risk factors to include hypertension hyperlipidemia as well as smoking history.  I will reorder cardiac CTA.  Unfortunately the patient will be traveling home to  [place] for the next 3 months prior to being able to complete the CT scan sooner.  Will reorder this for when he gets back probably March or April.  He will need to have a be met prior to this procedure.  He verbalizes understanding and we will get this scheduled for him prior to leaving the office today.  2.  Hypertension: Blood pressure is low normal today.  Continue him on amlodipine and metoprolol as directed.  Continue to follow this periodically.  May need to make medication adjustments if he is symptomatic.  3.  Hypercholesterolemia: Remains on atorvastatin  40 mg daily.  Labs are followed by PCP.  Most recent labs in March 2024 reveals good control of cholesterol on this current medication regimen.  He is due to have follow-up labs which are annually measured.  Would not make any changes in his medication at this time.  4.  Aortic root dilatation: Last measured at 40 mm.  CT scan is also being ordered for reevaluation of same.    Atypical chest pain   Signed, Bettey Mare. Liborio Nixon, ANP, AACC

## 2023-07-24 ENCOUNTER — Encounter: Payer: Self-pay | Admitting: Adult Health

## 2023-07-24 ENCOUNTER — Ambulatory Visit: Payer: Self-pay | Attending: Adult Health | Admitting: Adult Health

## 2023-07-24 ENCOUNTER — Telehealth: Payer: Self-pay | Admitting: Licensed Clinical Social Worker

## 2023-07-24 ENCOUNTER — Ambulatory Visit: Payer: Self-pay | Admitting: Family Medicine

## 2023-07-24 VITALS — BP 106/72 | HR 67 | Ht 71.0 in | Wt 224.6 lb

## 2023-07-24 DIAGNOSIS — R0789 Other chest pain: Secondary | ICD-10-CM

## 2023-07-24 DIAGNOSIS — E7800 Pure hypercholesterolemia, unspecified: Secondary | ICD-10-CM

## 2023-07-24 DIAGNOSIS — I77810 Thoracic aortic ectasia: Secondary | ICD-10-CM

## 2023-07-24 DIAGNOSIS — I10 Essential (primary) hypertension: Secondary | ICD-10-CM

## 2023-07-24 NOTE — Patient Instructions (Signed)
Medication Instructions:  No Changes *If you need a refill on your cardiac medications before your next appointment, please call your pharmacy*   Lab Work: BMET In 3months If you have labs (blood work) drawn today and your tests are completely normal, you will receive your results only by: MyChart Message (if you have MyChart) OR A paper copy in the mail If you have any lab test that is abnormal or we need to change your treatment, we will call you to review the results.   Testing/Procedures: No Testing   Follow-Up: At [facility], you and your health needs are our priority.  As part of our continuing mission to provide you with exceptional heart care, we have created designated Provider Care Teams.  These Care Teams include your primary Cardiologist (physician) and Advanced Practice Providers (APPs -  Physician Assistants and Nurse Practitioners) who all work together to provide you with the care you need, when you need it.  We recommend signing up for the patient portal called "MyChart".  Sign up information is provided on this After Visit Summary.  MyChart is used to connect with patients for Virtual Visits (Telemedicine).  Patients are able to view lab/test results, encounter notes, upcoming appointments, etc.  Non-urgent messages can be sent to your provider as well.   To learn more about what you can do with MyChart, go to [email or web address].    Your next appointment:   April 2025  Provider:   Peter Swaziland, MD

## 2023-07-24 NOTE — Telephone Encounter (Signed)
H&V Care Navigation CSW Progress Note  Clinical Social Worker  completed chart review  to see if pt has insurance benefits. Appears to have [facility] card scanned in but no pharmacy benefits populating. Will f/u tomorrow to clarify as I am at a different clinic today.  Patient is participating in a Managed Medicaid Plan:  No, possibly UHC commercial only  SDOH Screenings   Depression (PHQ2-9): Low Risk  (01/23/2023)  Tobacco Use: High Risk (07/24/2023)   Octavio Graves, MSW, LCSW Clinical Social Worker [facility] Heart/Vascular Care Navigation  [phone]- work cell phone (preferred) [phone]- desk phone

## 2023-08-10 ENCOUNTER — Ambulatory Visit: Payer: Self-pay | Admitting: Family Medicine

## 2023-08-11 ENCOUNTER — Ambulatory Visit: Payer: Self-pay | Admitting: Family Medicine

## 2023-08-13 ENCOUNTER — Encounter (HOSPITAL_COMMUNITY): Payer: Self-pay

## 2023-11-17 NOTE — Progress Notes (Deleted)
 Cardiology Office Note:  .   Date:  11/17/2023  ID:  Jared Porter, DOB 1961-05-25, MRN [id number] PCP: Lockie Mola, MD  [facility] Providers Cardiologist:  Dr. Swaziland    History of Present Illness: .   Jared Porter is a 63 y.o. male with hx of aortic root enlargement noted on Echo, HTN, HLD, pre diabetes, and  tobacco use.  When seen by Dr. Swaziland on 06/13/2023 a cardiac CTA was ordered due to recurrent chest pain.  This gentleman is from [place] and requires interpreter for language barrier.  He states he continues to have discomfort in his chest when he lays on his stomach.  He does not have exertional chest pain with work, heavy lifting, walking, or running.  He denies any wheezing or coughing.  He has been medically compliant.  However he has not been using an inhaler as he was told it was for asthma and he does not feel that he has it.  He has been compliant with antihypertensive medications and lipid-lowering agents.  He did not have his coronary CTA completed because he had gone to the wrong place and missed his appointment.  This will need to be reordered.  ROS: As above otherwise negative  Studies Reviewed: .     Echo 04/27/23:  IMPRESSIONS   1. Left ventricular ejection fraction, by estimation, is 55 to 60%. The  left ventricle has normal function. The left ventricle has no regional  wall motion abnormalities. There is mild concentric left ventricular  hypertrophy. Left ventricular diastolic  parameters are consistent with Grade I diastolic dysfunction (impaired  relaxation). The average left ventricular global longitudinal strain is  -18.4 %. The global longitudinal strain is normal.   2. Right ventricular systolic function is normal. The right ventricular  size is normal. There is normal pulmonary artery systolic pressure.   3. The mitral valve is normal in structure. Trivial mitral valve  regurgitation. No evidence of mitral stenosis.   4. The  aortic valve is tricuspid. Aortic valve regurgitation is not  visualized. No aortic stenosis is present.   5. Aortic dilatation noted. There is mild dilatation of the aortic root,  measuring 40 mm.   6. The inferior vena cava is normal in size with greater than 50%  respiratory variability, suggesting right atrial pressure of 3 mmHg.     Physical Exam:   VS:  There were no vitals taken for this visit.   Wt Readings from Last 3 Encounters:  07/24/23 224 lb 9.6 oz (101.9 kg)  07/11/23 223 lb (101.2 kg)  06/19/23 215 lb 6.4 oz (97.7 kg)    GEN: Well nourished, well developed in no acute distress NECK: No JVD; No carotid bruits CARDIAC: RRR, no murmurs, rubs, gallops RESPIRATORY:  Clear to auscultation without rales, wheezing or rhonchi  ABDOMEN: Soft, non-tender, non-distended EXTREMITIES:  No edema; No deformity   ASSESSMENT AND PLAN: .    Chest pain: Appears atypical from cardiac pain.  The patient states he noticed it when he lays on his stomach but not on his back.  He denies it during exertional activities.  He does have cardiovascular risk factors to include hypertension hyperlipidemia as well as smoking history.  I will reorder cardiac CTA.  Unfortunately the patient will be traveling home to [place] for the next 3 months prior to being able to complete the CT scan sooner.  Will reorder this for when he gets back probably March or April.  He will need to  have a be met prior to this procedure.  He verbalizes understanding and we will get this scheduled for him prior to leaving the office today.  2.  Hypertension: Blood pressure is low normal today.  Continue him on amlodipine and metoprolol as directed.  Continue to follow this periodically.  May need to make medication adjustments if he is symptomatic.  3.  Hypercholesterolemia: Remains on atorvastatin 40 mg daily.  Labs are followed by PCP.  Most recent labs in March 2024 reveals good control of cholesterol on this current medication  regimen.  He is due to have follow-up labs which are annually measured.  Would not make any changes in his medication at this time.  4.  Aortic root dilatation: Last measured at 40 mm.  CT scan is also being ordered for reevaluation of same.    Atypical chest pain   Signed, Bettey Mare. Liborio Nixon, ANP, AACC

## 2023-11-22 ENCOUNTER — Ambulatory Visit: Payer: Self-pay | Attending: Cardiology | Admitting: Cardiology

## 2024-03-20 ENCOUNTER — Other Ambulatory Visit: Payer: Self-pay | Admitting: Family Medicine

## 2024-03-20 DIAGNOSIS — E785 Hyperlipidemia, unspecified: Secondary | ICD-10-CM

## 2024-04-23 ENCOUNTER — Ambulatory Visit (INDEPENDENT_AMBULATORY_CARE_PROVIDER_SITE_OTHER): Payer: Self-pay | Admitting: Family Medicine

## 2024-04-23 ENCOUNTER — Encounter: Payer: Self-pay | Admitting: Family Medicine

## 2024-04-23 VITALS — BP 124/87 | HR 73 | Ht 71.0 in | Wt 210.4 lb

## 2024-04-23 DIAGNOSIS — F17209 Nicotine dependence, unspecified, with unspecified nicotine-induced disorders: Secondary | ICD-10-CM

## 2024-04-23 DIAGNOSIS — E785 Hyperlipidemia, unspecified: Secondary | ICD-10-CM

## 2024-04-23 DIAGNOSIS — I77810 Thoracic aortic ectasia: Secondary | ICD-10-CM

## 2024-04-23 DIAGNOSIS — R7303 Prediabetes: Secondary | ICD-10-CM

## 2024-04-23 DIAGNOSIS — L308 Other specified dermatitis: Secondary | ICD-10-CM

## 2024-04-23 MED ORDER — NICOTINE POLACRILEX 2 MG MT GUM: 2.0000 mg | CHEWING_GUM | OROMUCOSAL | 0 refills | Status: AC | PRN

## 2024-04-23 NOTE — Progress Notes (Cosign Needed)
    SUBJECTIVE:   CHIEF COMPLAINT / HPI:   Hypertension  Patient says he has been taking one antihypertensive. Unsure what it is called. Patient has prescription for amlodipine  and metoprolol . Denies adverse side effects.   Hypercholesterolemia  Patient has not been taking his atorvastatin . He is unsure for how long he has not been taking it.   Tobacco use  Patient has increased cigarettes per day from 5 to 12 a day. He has been much more stressed given two of  his brothers passed away and his home country of [place] has been having worse war. He was unable to visit his family recently due to the war. He had been excited to see his family after 7 years.  He is amenable to trying to quit. He says nicotine  gum was helpful last time.  PERTINENT  PMH / PSH: Aortic root dilation, HTN  OBJECTIVE:   BP 120/81   Pulse 73   Ht 5' 11 (1.803 m)   Wt 210 lb 6.4 oz (95.4 kg)   SpO2 100%   BMI 29.34 kg/m   General: well appearing, in no acute distress CV:  right side of sternal border, systolic crescendo murmur II/VI, RRR, radial pulses equal and palpable, no BLE edema  Resp: Normal work of breathing on room air, CTAB    ASSESSMENT/PLAN:   Assessment & Plan Prediabetes Last A1c 5.9 one year ago.  - Follow up A1c today  Psoriasiform dermatitis Patient unfortunately missed his appointment with dermatology in March. He has history of biopsy proven psoriasis was previously on methotrexate . Had lapse in medication due to not being able to see a dermatologist for a few years. - Referral to dermatology  - Refer to our [facility] derm clinic in the meantime.  Hyperlipidemia LDL goal <100 Not currently using atorvastatin . LDL had been within goal when patient was taking medication.  Tobacco use disorder, continuous Is interested in cessation though difficult with his current stressful situation.  - Restart nicotine  gum  Aortic root dilation [facility]) Previously cardiology recommended Coronary CTA given  aortic root dilation and previous anginal symptoms. Imaging had been ordered but not scheduled. No longer having anginal symptoms, but still recommended.  - Ordered coronary CTA     Areta Saliva, MD [facility]

## 2024-04-23 NOTE — Assessment & Plan Note (Signed)
 Last A1c 5.9 one year ago.  - Follow up A1c today

## 2024-04-23 NOTE — Patient Instructions (Addendum)
????? ?????? ?????.  ???? ????? ???? ?????? ??? ?? ?? ?????.  ?????? ????? ??:  ??? ????? ?? ?????? ?? ??????? ?????? ??????? ????? ??? ??????. ????? ????? ??????????? ????.  ?????? ?? ???? ??????? ???????? ??? ??????? ?? ???????.  ???? ????? ???? ??? ????.  ????? ????? ??? ????? ????? ????? ??????? ??????? ?? ????? ????? ??? ????? ?? ??? ?? ????? ??????.  ??????? ?? ?????? ??? ??????? ?????? ???? ???????? ?????? ???????? ?? ?????? ??? ???????.  ???? ???????? ??? ???.    It was wonderful to see you today.  Please bring ALL of your medications with you to every visit.   Today we talked about:  I have scheduled you for a lab appointment at 3pm on Thursday. Then we will check your cholesterol.   I have sent in nicotine  gum to help quit smoking.   Please take your blood pressure medicine.   I have ordered a test to look at your heart due to the stretching of the vessel in your heart and since you had been having chest pain earlier.   They will call you to schedule this, the dermatology visit and the visit to help with citizenship.   Please make a follow up in one month.  Areta Saliva, MD  Family Medicine

## 2024-04-23 NOTE — Assessment & Plan Note (Signed)
 Previously cardiology recommended Coronary CTA given aortic root dilation and previous anginal symptoms. Imaging had been ordered but not scheduled. No longer having anginal symptoms, but still recommended.  - Ordered coronary CTA

## 2024-04-24 ENCOUNTER — Telehealth: Payer: Self-pay

## 2024-04-24 NOTE — Telephone Encounter (Signed)
-----   Message from [facility] Reena S sent at 04/24/2024  7:53 AM EDT ----- Good Morning!,  This patient does not have insurance. Ok to schedule at a [facility] facility.  Thank you!  Margit

## 2024-04-24 NOTE — Telephone Encounter (Signed)
 Called centralized scheduling and spoke with Peggy.  Winton stated that she doesn't schedule the type of CT that patient needs and provided me with Cardiac Imaging number that does and the scheduler name which is [place].  Called cardiac imaging to speak with brittany to schedule patient for CT. Left VM of patient's information for [place] to schedule the appointment.  Harlene Reiter, CMA

## 2024-04-25 ENCOUNTER — Other Ambulatory Visit: Payer: Self-pay

## 2024-04-25 DIAGNOSIS — R7303 Prediabetes: Secondary | ICD-10-CM

## 2024-04-25 DIAGNOSIS — E785 Hyperlipidemia, unspecified: Secondary | ICD-10-CM

## 2024-04-26 LAB — LIPID PANEL
Chol/HDL Ratio: 3.8 ratio (ref 0.0–5.0)
Cholesterol, Total: 203 mg/dL — ABNORMAL HIGH (ref 100–199)
HDL: 54 mg/dL (ref >39)
LDL Chol Calc (NIH): 121 mg/dL — ABNORMAL HIGH (ref 0–99)
Triglycerides: 157 mg/dL — ABNORMAL HIGH (ref 0–149)
VLDL Cholesterol Cal: 28 mg/dL (ref 5–40)

## 2024-04-29 ENCOUNTER — Telehealth: Payer: Self-pay | Admitting: Family Medicine

## 2024-04-29 NOTE — Telephone Encounter (Signed)
 Called patient with interpreter to discuss labs and tell patient to schedule follow up appointment. Patient did not answer phone call. Left VM asking patient to call back and schedule a follow up appointment.   I wanted to tell patient that based on his cholesterol he should be taking the prescribed cholesterol medicine (he had stopped taking it). Additionally wanted to ask patient to make a follow up appointment with me so that we could restart his psoriasis medication. We have to do some labs before restarting that medicine; hence, we need the appointment.

## 2024-05-02 ENCOUNTER — Telehealth: Payer: Self-pay | Admitting: Family Medicine

## 2024-05-02 NOTE — Telephone Encounter (Signed)
-----   Message from Suzann CHRISTELLA Daring sent at 05/01/2024 11:44 AM EDT ----- Regarding: Schedule for [phone] [employer]  Team,  Please schedule this patient for follow up in immigrant clinic for an N648 (ideally 60--90 minutes) in late November in one of the open dates.  Please call him after 2 PM---he works third shift.   Thanks, Suzann Daring, MD  [facility] Medicine Teaching Service

## 2024-05-02 NOTE — Telephone Encounter (Signed)
 Called patient to schedule. Just rings and rings and says call can't be completed.   If patient calls back please get Shae.   Thanks!

## 2024-05-03 ENCOUNTER — Ambulatory Visit: Payer: Self-pay | Admitting: Family Medicine

## 2024-05-03 ENCOUNTER — Encounter: Payer: Self-pay | Admitting: Family Medicine

## 2024-05-03 VITALS — BP 128/81 | HR 86 | Wt 213.8 lb

## 2024-05-03 DIAGNOSIS — R7303 Prediabetes: Secondary | ICD-10-CM

## 2024-05-03 DIAGNOSIS — I10 Essential (primary) hypertension: Secondary | ICD-10-CM

## 2024-05-03 DIAGNOSIS — L308 Other specified dermatitis: Secondary | ICD-10-CM

## 2024-05-03 DIAGNOSIS — E785 Hyperlipidemia, unspecified: Secondary | ICD-10-CM

## 2024-05-03 DIAGNOSIS — L409 Psoriasis, unspecified: Secondary | ICD-10-CM

## 2024-05-03 MED ORDER — CLOBETASOL PROPIONATE 0.05 % EX OINT: 1.0000 | TOPICAL_OINTMENT | Freq: Two times a day (BID) | CUTANEOUS | 0 refills | Status: AC

## 2024-05-03 MED ORDER — ATORVASTATIN CALCIUM 40 MG PO TABS: 40.0000 mg | ORAL_TABLET | Freq: Every day | ORAL | 3 refills | Status: AC

## 2024-05-03 MED ORDER — FOLIC ACID 1 MG PO TABS
1.0000 mg | ORAL_TABLET | Freq: Every day | ORAL | 1 refills | Status: AC
Start: 2024-05-03 — End: ?

## 2024-05-03 MED ORDER — METHOTREXATE SODIUM 10 MG PO TABS: 10.0000 mg | ORAL_TABLET | ORAL | 1 refills | Status: DC

## 2024-05-03 NOTE — Patient Instructions (Addendum)
????? ????? ??? ???????? ???????? ????? ??? ?????? ???? ???????. ???? ?????: ??????? ???????????? ??? ??? ???????. ??????? ??? ???????? ??? ???? ???? ????? ??? ?????? ???????? ?????????????.  ?????? ????? ??? ??? ???? ?? ??? ????? ?? ?? ???? ?????? ????. ?????? ????? ?????? ???????.  ????? ????? ??????????? ???? ????? ??? ?????? ?? ????? ???? ???????????. ????? ??? ??? ??????. ???? ????????????.  ????? ???? ?????? ?? ?? ??????.  ????? ???????? ???? ?? ?????? ?? ?????? ??? ??????? ?? ?? ??????.    Today we are going to do some labs so that I can start you on the psoriasis medication. There are two pills. One is called methotrexate . this is a weekly pill. The other medicine is called folic acid . This is a daily medication to help avoid some of the side effects caused by methotrexate .  I will follow up with you in a month to see if you are having any other side effects. I also sent in an ointment for psoriasis as well.   You also have higher cholesterol once you stopped the cholesterol medicine. I refilled this. It is called atorvastatin .   We have a follow up appointment on october 27th.   Your appointment for the exam that can help with citizenship is november 18th.

## 2024-05-03 NOTE — Progress Notes (Signed)
    SUBJECTIVE:   CHIEF COMPLAINT / HPI:   Psoriasis  Patient continues to have moderate psoriasis plaques on extensor surfaces of bilateral forearms and legs up to his knees. He also has plaques on his forehead and a few on back side of his neck.  He is interested in restarting his methotrexate . He never had any bad side effects to the medication. He only stopped it due to change in provider during which it was not continued while he was not continuing to see a dermatologist.  Patient missed his previous dermatology appointment due to being in [place]. He would like to be started on some medication in the meantime.  He does wear sun protective clothing and wears long sleeves and pants as well as a hat whenever he is out of the house.  He does endorse some mild knee pain bilaterally but otherwise denies joint pain.  Previously clobetasol  has not been very effective for him.   Patient is currently waiting on his insurance card. He is starting a new job now.   PERTINENT  PMH / PSH: Tobacco use, HLD, Psoriasis, Prediabetes  OBJECTIVE:   BP 128/81   Pulse 86   Wt 213 lb 12.8 oz (97 kg)   BMI 29.82 kg/m   General: well appearing, in no acute distress  CV: well perfused  Resp: Normal work of breathing on room air  Derm: large plaques with silver-white scale on extensor surfaces of bilateral forearms, bilateral legs up to the knees, few scattered plaques on his forehead and back of the neck, scalp is more improved and clear from previously.   ASSESSMENT/PLAN:   Assessment & Plan Psoriasis Patient has approximately 20-25% BSA affected by moderate psoriasis that is resistant to topical therapy.  - Plan to restart weekly methotrexate  10 mg (his previous dose) with daily folic acid   - Obtain baseline labs today with CBC, CMP - Follow up in one month  - Referral for dermatology previously placed, awaiting scheduling of appt  Primary hypertension Well controlled at this time.  - Continue  current medications.  Hyperlipidemia Discussed previous labs with patient. Uncontrolled given he had not been taking the atorvastatin  anymore. Counseled patient that he had well controlled LDL when on the medication.  - Refilled atorvastatin .      Areta Saliva, MD [facility]

## 2024-05-04 LAB — CBC WITH DIFFERENTIAL/PLATELET
Basophils Absolute: 0.1 x10E3/uL (ref 0.0–0.2)
Basos: 1 %
EOS (ABSOLUTE): 0.3 x10E3/uL (ref 0.0–0.4)
Eos: 5 %
Hematocrit: 43.5 % (ref 37.5–51.0)
Hemoglobin: 14.4 g/dL (ref 13.0–17.7)
Immature Grans (Abs): 0 x10E3/uL (ref 0.0–0.1)
Immature Granulocytes: 0 %
Lymphocytes Absolute: 1.6 x10E3/uL (ref 0.7–3.1)
Lymphs: 32 %
MCH: 30.8 pg (ref 26.6–33.0)
MCHC: 33.1 g/dL (ref 31.5–35.7)
MCV: 93 fL (ref 79–97)
Monocytes Absolute: 0.6 x10E3/uL (ref 0.1–0.9)
Monocytes: 13 %
Neutrophils Absolute: 2.5 x10E3/uL (ref 1.4–7.0)
Neutrophils: 49 %
Platelets: 243 x10E3/uL (ref 150–450)
RBC: 4.68 x10E6/uL (ref 4.14–5.80)
RDW: 12.6 % (ref 11.6–15.4)
WBC: 5.1 x10E3/uL (ref 3.4–10.8)

## 2024-05-04 LAB — COMPREHENSIVE METABOLIC PANEL WITH GFR
ALT: 22 IU/L (ref 0–44)
AST: 17 IU/L (ref 0–40)
Albumin: 4 g/dL (ref 3.9–4.9)
Alkaline Phosphatase: 90 IU/L (ref 47–123)
BUN/Creatinine Ratio: 13 (ref 10–24)
BUN: 11 mg/dL (ref 8–27)
Bilirubin Total: 0.2 mg/dL (ref 0.0–1.2)
CO2: 21 mmol/L (ref 20–29)
Calcium: 9.2 mg/dL (ref 8.6–10.2)
Chloride: 107 mmol/L — ABNORMAL HIGH (ref 96–106)
Creatinine, Ser: 0.85 mg/dL (ref 0.76–1.27)
Globulin, Total: 2.5 g/dL (ref 1.5–4.5)
Glucose: 132 mg/dL — ABNORMAL HIGH (ref 70–99)
Potassium: 4.1 mmol/L (ref 3.5–5.2)
Sodium: 141 mmol/L (ref 134–144)
Total Protein: 6.5 g/dL (ref 6.0–8.5)
eGFR: 98 mL/min/1.73 (ref >59)

## 2024-05-04 LAB — HEMOGLOBIN A1C
Est. average glucose Bld gHb Est-mCnc: 114 mg/dL
Hgb A1c MFr Bld: 5.6 % (ref 4.8–5.6)

## 2024-05-04 NOTE — Assessment & Plan Note (Signed)
Well controlled at this time.  Continue current medications.   

## 2024-05-04 NOTE — Addendum Note (Signed)
 Addended by: Jordanna Hendrie on: 05/04/2024 03:08 AM   Modules accepted: Level of Service

## 2024-05-04 NOTE — Assessment & Plan Note (Addendum)
 Patient has approximately 20-25% BSA affected by moderate psoriasis that is resistant to topical therapy.  - Plan to restart weekly methotrexate  10 mg (his previous dose) with daily folic acid   - Obtain baseline labs today with CBC, CMP - Follow up in one month  - Referral for dermatology previously placed, awaiting scheduling of appt

## 2024-05-23 ENCOUNTER — Encounter (HOSPITAL_COMMUNITY): Payer: Self-pay

## 2024-06-03 ENCOUNTER — Ambulatory Visit (INDEPENDENT_AMBULATORY_CARE_PROVIDER_SITE_OTHER): Payer: Self-pay | Admitting: Family Medicine

## 2024-06-03 ENCOUNTER — Encounter: Payer: Self-pay | Admitting: Family Medicine

## 2024-06-03 VITALS — BP 125/86 | HR 96 | Wt 224.4 lb

## 2024-06-03 DIAGNOSIS — L409 Psoriasis, unspecified: Secondary | ICD-10-CM

## 2024-06-03 DIAGNOSIS — Z5181 Encounter for therapeutic drug level monitoring: Secondary | ICD-10-CM

## 2024-06-03 DIAGNOSIS — Z79631 Long term (current) use of antimetabolite agent: Secondary | ICD-10-CM

## 2024-06-03 DIAGNOSIS — Z599 Problem related to housing and economic circumstances, unspecified: Secondary | ICD-10-CM

## 2024-06-03 DIAGNOSIS — Z716 Tobacco abuse counseling: Secondary | ICD-10-CM

## 2024-06-03 NOTE — Patient Instructions (Addendum)
????? ?????? ?????.  ???? ????? ???? ?????? ??? ?? ?? ?????.  ?????? ????? ??:  ??????? - ?? ???? ?????? ???? ??????? ?????? ???????????. ??? ????? ??? ??????? ???? ??????? ?? ????????????? ??????? ?? ?????? ???????? ?????????????. ?????? ?? ???? ???? ????? ?????? ?? ??? ??????? ?????? ?? ????? ???? ?????? ???? ?????. ????? ?? ??? ??? ??? ????? ?????? ?? ?????. ??? ????? ??????? ??????? ?????.  ????? ???? ??? ???????. ??? ?????? ??? ??? ?????? ?????????? ???????? ?? ??????? ?? ?????? ???????.  ?????? ????? ??????? ??? ?? ????? ??????.  ????? ???????? ????? ??? ???? ??? ??????.  ???? ??????? ??? ?????   [id number] ??? ????????? ??? ???? ?????.  ????? ?????? ??????? ?? ???? ?? ??????    It was wonderful to see you today.  Please bring ALL of your medications with you to every visit.   Today we talked about:  Psoriasis - You have not started the actual medicine for psoriasis called methotrexate . The folic acid  you are taking is to be taken with methotrexate  to prevent side effects from the methotrexate . The throat irritation you get from folic acid  is most likely due to taking all of your pills at once. Take each one at a time and drink lots of water. If the irritation continues definitely let me know.   We have an appointment in two weeks. I have also made a referral to social work so that they can help with financial concerns.  I will review your labs with you at your next appointment.   Thank you for choosing [facility].   Please call [phone] with any questions about today's appointment.  Areta Saliva, MD  Family Medicine

## 2024-06-03 NOTE — Progress Notes (Signed)
   SUBJECTIVE:   CHIEF COMPLAINT / HPI:  Discussed the use of AI scribe software for clinical note transcription with the patient, who gave verbal consent to proceed.  History of Present Illness Jared Porter is a 63 year old male with psoriasis who presents for follow-up.  Plaque psoriasis - Moderate severity, no sign of psoriatic arthritis or other systemic involvement. - Previously managed with methotrexate , which was discontinued due to a gap in care - Unresponsive to topical treatments - Methotrexate  was planned to be restarted, but not obtained due to insurance issues.  Was prescribed at last visit however due to miscommunication and misunderstanding at pharmacy patient did not pick up the methotrexate  but only picked up the folic acid .  Medication adherence and adverse effects -Patient says he is having some throat irritation and pain when taking folic acid  daily.  Does not have this symptom when he is not taking it.  Takes all 3 of his medications at the same time.  Tobacco use - Significant increase in tobacco use following a recent trip to [place] - Trip involved bereavement with the loss of two brothers - Not using nicotine  replacement therapies due to cost    PERTINENT  PMH / PSH: Psoriasis, HTN, MCI, Tobacco use   OBJECTIVE:  BP 137/86   Pulse 96   Wt 224 lb 6.4 oz (101.8 kg)   BMI 31.30 kg/m    General: well appearing, in no acute distress CV: RRR, radial pulses equal and palpable, no BLE edema  Resp: Normal work of breathing on room air, CTAB Neuro: Alert & Oriented  Psych: Pleasant, conversant Derm: Thick silvery plaques covering dorsal surface of forearms ventral surface of lower legs.  ASSESSMENT/PLAN:   Assessment & Plan Psoriasis Moderate plaque psoriasis on arms and legs, previously managed with methotrexate , currently not responding to topical medications. Methotrexate  restart delayed due to insurance issues. No adverse side effects with  previous methotrexate  use.  - Ensure methotrexate  is picked up from pharmacy and taken once weekly. - Perform baseline labs CBC, CMP before restarting methotrexate . - Consult social work for assistance with insurance and financial resources. - Follow up in one month to assess response to methotrexate . - Encourage attendance at dermatology appointment in May 2026. Tobacco abuse counseling Tobacco use Increased tobacco use due to stress from recent trip to [place] and loss of family members. Previously attempted nicotine  replacement therapy but found it cost-prohibitive. - Encourage reduction in smoking. -Discussed stress management strategies - Continue to act as support for patient and will follow-up next appointment.    Areta Saliva, MD [facility]

## 2024-06-04 LAB — COMPREHENSIVE METABOLIC PANEL WITH GFR
ALT: 24 IU/L (ref 0–44)
AST: 18 IU/L (ref 0–40)
Albumin: 4.3 g/dL (ref 3.9–4.9)
Alkaline Phosphatase: 94 IU/L (ref 47–123)
BUN/Creatinine Ratio: 19 (ref 10–24)
BUN: 16 mg/dL (ref 8–27)
Bilirubin Total: 0.3 mg/dL (ref 0.0–1.2)
CO2: 20 mmol/L (ref 20–29)
Calcium: 9.5 mg/dL (ref 8.6–10.2)
Chloride: 107 mmol/L — ABNORMAL HIGH (ref 96–106)
Creatinine, Ser: 0.86 mg/dL (ref 0.76–1.27)
Globulin, Total: 2.4 g/dL (ref 1.5–4.5)
Glucose: 132 mg/dL — ABNORMAL HIGH (ref 70–99)
Potassium: 3.8 mmol/L (ref 3.5–5.2)
Sodium: 142 mmol/L (ref 134–144)
Total Protein: 6.7 g/dL (ref 6.0–8.5)
eGFR: 97 mL/min/1.73 (ref >59)

## 2024-06-04 LAB — CBC WITH DIFFERENTIAL/PLATELET
Basophils Absolute: 0.1 x10E3/uL (ref 0.0–0.2)
Basos: 1 %
EOS (ABSOLUTE): 0.3 x10E3/uL (ref 0.0–0.4)
Eos: 5 %
Hematocrit: 47.3 % (ref 37.5–51.0)
Hemoglobin: 16 g/dL (ref 13.0–17.7)
Immature Grans (Abs): 0 x10E3/uL (ref 0.0–0.1)
Immature Granulocytes: 0 %
Lymphocytes Absolute: 1.8 x10E3/uL (ref 0.7–3.1)
Lymphs: 28 %
MCH: 31.1 pg (ref 26.6–33.0)
MCHC: 33.8 g/dL (ref 31.5–35.7)
MCV: 92 fL (ref 79–97)
Monocytes Absolute: 0.6 x10E3/uL (ref 0.1–0.9)
Monocytes: 10 %
Neutrophils Absolute: 3.5 x10E3/uL (ref 1.4–7.0)
Neutrophils: 56 %
Platelets: 249 x10E3/uL (ref 150–450)
RBC: 5.14 x10E6/uL (ref 4.14–5.80)
RDW: 12.2 % (ref 11.6–15.4)
WBC: 6.3 x10E3/uL (ref 3.4–10.8)

## 2024-06-05 NOTE — Assessment & Plan Note (Signed)
 Moderate plaque psoriasis on arms and legs, previously managed with methotrexate , currently not responding to topical medications. Methotrexate  restart delayed due to insurance issues. No adverse side effects with previous methotrexate  use.  - Ensure methotrexate  is picked up from pharmacy and taken once weekly. - Perform baseline labs CBC, CMP before restarting methotrexate . - Consult social work for assistance with insurance and financial resources. - Follow up in one month to assess response to methotrexate . - Encourage attendance at dermatology appointment in May 2026.

## 2024-06-10 ENCOUNTER — Other Ambulatory Visit: Payer: Self-pay | Admitting: Family Medicine

## 2024-06-10 DIAGNOSIS — E785 Hyperlipidemia, unspecified: Secondary | ICD-10-CM

## 2024-06-17 ENCOUNTER — Telehealth: Payer: Self-pay | Admitting: Pharmacist

## 2024-06-17 ENCOUNTER — Ambulatory Visit: Payer: Self-pay | Admitting: Family Medicine

## 2024-06-17 ENCOUNTER — Encounter: Payer: Self-pay | Admitting: Family Medicine

## 2024-06-17 VITALS — BP 130/80 | HR 84 | Ht 71.0 in | Wt 227.4 lb

## 2024-06-17 DIAGNOSIS — Z79631 Long term (current) use of antimetabolite agent: Secondary | ICD-10-CM

## 2024-06-17 DIAGNOSIS — L409 Psoriasis, unspecified: Secondary | ICD-10-CM

## 2024-06-17 DIAGNOSIS — Z5181 Encounter for therapeutic drug level monitoring: Secondary | ICD-10-CM

## 2024-06-17 DIAGNOSIS — Z716 Tobacco abuse counseling: Secondary | ICD-10-CM

## 2024-06-17 MED ORDER — METHOTREXATE SODIUM 2.5 MG PO TABS: 10.0000 mg | ORAL_TABLET | ORAL | 1 refills | Status: DC

## 2024-06-17 NOTE — Patient Instructions (Addendum)
????? ?????? ?????.  ???? ????? ???? ?????? ??? ?? ?? ?????.  ?????? ????? ??:  ??????? - ??? ????? ????? ?????! ????? ?? ????? ?????????????. ?????? ????? ????? ??????? ??? ????????? ??? ?? ???? ????? ???. ?????? ???? ??? ???? ?????   4 ????? ?????? ?????? ?????. ???? ?????? ??????? ??? ???????. ????? ?? ??? ?????? ????????.  ??? ????? ???? ?? ??????? ???????.  ????? ???????? ????? ??? ???? ??? ??????.  ???? ??????? ??? [id number] ??? ????????? ??? ???? ?????.  ????? ?????? ????? ?? ??????    It was wonderful to see you today.  Please bring ALL of your medications with you to every visit.   Today we talked about:  Psoriasis - I am glad this is getting better! Keep taking the methotrexate . I sent a different prescription to the pharmacy because hopefully it is cheaper. It is a lower dose so you have to take 4 tablets to equal the same dose. You have a lab visit in 2 weeks. I will call you after your labs.   I am so proud of you for decreasing your cigarette smoking.   Thank you for choosing [facility].   Please call [phone] with any questions about today's appointment.  Areta Saliva, MD  Family Medicine

## 2024-06-17 NOTE — Telephone Encounter (Signed)
 Contacted [facility] to determine cost of Methotrexate  - 10mg  - 1 tablet weekly.  Cost < $5 per 4 weeks supply   Through [address] [place] - [phone] Left Message Requesting call back.  [phone]   Medication Plan: -Ask patient to contact pharmacy - [facility] to attempt purchase of low cost medication.    Total time with patient call and documentation of interaction: 8 minutes.  F/U Phone call planned: None

## 2024-06-17 NOTE — Progress Notes (Signed)
   SUBJECTIVE:   CHIEF COMPLAINT / HPI:  Discussed the use of AI scribe software for clinical note transcription with the patient, who gave verbal consent to proceed.  History of Present Illness Jared Porter is a 63 year old male with psoriasis who presents for follow-up on methotrexate  treatment.  Psoriatic symptoms and treatment response - Diagnosed with psoriasis - Currently taking methotrexate , two pills weekly - Completed two doses of methotrexate  with two weeks remaining in current supply - Decreased pruritus and swelling since starting methotrexate  - Takes folic acid  daily as adjunct therapy - previous negative hepatitis testing, patient does not drink alcohol and has never before.   Medication access and financial barriers - Methotrexate  cost is $200 for four pills - No current insurance coverage - Plans to apply for insurance upon starting employment  Tobacco use - Reducing cigarette consumption from ten to seven cigarettes daily - Smoked two cigarettes today - cannot afford patches or gum at this time   Chronic disease management - Takes atorvastatin  for hyperlipidemia - Takes amlodipine  for hypertension    PERTINENT  PMH / PSH: Psoriasis, HTN, tobacco use   OBJECTIVE:  BP 130/80   Pulse 84   Ht 5' 11 (1.803 m)   Wt 227 lb 6 oz (103.1 kg)   SpO2 100%   BMI 31.71 kg/m   Physical Exam   General: well appearing, in no acute distress Resp: Normal work of breathing on room air Neuro: Alert & Oriented  Skin: plaques on forearms slightly smaller than prior, now go down to mid forearms rather than distal forearms.   ASSESSMENT/PLAN:   Assessment & Plan Encounter for monitoring of methotrexate  therapy Psoriasis Greatly improved symptoms since starting methotrexate .  - Continue weekly medication.  - Prescribed 2.5 mg pills (4 pills at a time weekly for total of 10 mg weekly) to help with affordability but have since seen Dr. Rennis note regarding  affordability of 10 mg pills. Will reach out to patient  - Continue folic acid  adjunct daily  - CBC, CMP today  - Follow up in 2 weeks.  Tobacco abuse counseling Improved from 10 to 7 cigarettes. Patient has lots of stress but has been improving which is very commendable! - Will try patches and lozenges after insurance again.     Areta Saliva, MD [facility]

## 2024-06-17 NOTE — Assessment & Plan Note (Signed)
 Greatly improved symptoms since starting methotrexate .  - Continue weekly medication.  - Prescribed 2.5 mg pills (4 pills at a time weekly for total of 10 mg weekly) to help with affordability but have since seen Dr. Rennis note regarding affordability of 10 mg pills. Will reach out to patient  - Continue folic acid  adjunct daily  - CBC, CMP today  - Follow up in 2 weeks.

## 2024-06-18 ENCOUNTER — Ambulatory Visit: Payer: Self-pay | Admitting: Family Medicine

## 2024-06-18 LAB — COMPREHENSIVE METABOLIC PANEL WITH GFR
ALT: 27 IU/L (ref 0–44)
AST: 20 IU/L (ref 0–40)
Albumin: 4.1 g/dL (ref 3.9–4.9)
Alkaline Phosphatase: 86 IU/L (ref 47–123)
BUN/Creatinine Ratio: 13 (ref 10–24)
BUN: 11 mg/dL (ref 8–27)
Bilirubin Total: <0.2 mg/dL (ref 0.0–1.2)
CO2: 23 mmol/L (ref 20–29)
Calcium: 9.3 mg/dL (ref 8.6–10.2)
Chloride: 107 mmol/L — ABNORMAL HIGH (ref 96–106)
Creatinine, Ser: 0.83 mg/dL (ref 0.76–1.27)
Globulin, Total: 2.5 g/dL (ref 1.5–4.5)
Glucose: 99 mg/dL (ref 70–99)
Potassium: 4 mmol/L (ref 3.5–5.2)
Sodium: 141 mmol/L (ref 134–144)
Total Protein: 6.6 g/dL (ref 6.0–8.5)
eGFR: 98 mL/min/1.73 (ref >59)

## 2024-06-18 LAB — CBC WITH DIFFERENTIAL/PLATELET
Basophils Absolute: 0 x10E3/uL (ref 0.0–0.2)
Basos: 1 %
EOS (ABSOLUTE): 0.3 x10E3/uL (ref 0.0–0.4)
Eos: 5 %
Hematocrit: 44.2 % (ref 37.5–51.0)
Hemoglobin: 14.5 g/dL (ref 13.0–17.7)
Immature Grans (Abs): 0 x10E3/uL (ref 0.0–0.1)
Immature Granulocytes: 0 %
Lymphocytes Absolute: 2.1 x10E3/uL (ref 0.7–3.1)
Lymphs: 34 %
MCH: 30.9 pg (ref 26.6–33.0)
MCHC: 32.8 g/dL (ref 31.5–35.7)
MCV: 94 fL (ref 79–97)
Monocytes Absolute: 0.8 x10E3/uL (ref 0.1–0.9)
Monocytes: 12 %
Neutrophils Absolute: 3 x10E3/uL (ref 1.4–7.0)
Neutrophils: 48 %
Platelets: 215 x10E3/uL (ref 150–450)
RBC: 4.7 x10E6/uL (ref 4.14–5.80)
RDW: 12.2 % (ref 11.6–15.4)
WBC: 6.2 x10E3/uL (ref 3.4–10.8)

## 2024-06-24 NOTE — Progress Notes (Signed)
  Patient Name: Jared Porter Date of Birth: [birth year] Date of Visit: 06/25/24 PCP: Nicholas Bar, MD  Chief Complaint: form completion   Subjective: Jared Porter is a pleasant 63 y.o. with medical history significant for HTN and psoriasis presenting today for completion of N-648 form.   The patient speaks Arabic as their primary language.  An interpreter was used for the entire visit.   The purpose of this visit was explained with to the patient and family members. The patient was interviewed alone.   Identification confirmed and documented on N-648. In interviewing the patient, he is able to perform all ADLS and instrumental ADLs. He is looking for a job. He just returned from [place]. He previously worked at [employer]  Independent with ADL Function:  Ambulating: Yes Feeding:Yes Bathing:Yes Dressing:Yes Toileting: Yes Transferring: Yes Independent with Instrumental ADL Function   Finances:Yes Transportation: Yes  Meal preparation: Yes Household chores: Yes Communication with others: Yes Medications: Yes  Patient reports compliance with medications. His methotrexate  was expensive. He does not have insurance currently.   PMH:  HTN Plaque psoriasis    PSH: None   Social History: Years in [place] : 5 Prior number of attempts to attain citizenship: 0 Have you failed citizenship test before? NA  Have you previously taken English classes? At beginning      ROS: Per HPI.   I have reviewed the patient's medical, surgical, family, and social history as appropriate.  Vitals:   06/25/24 1415  BP: (!) 136/93  Pulse: 87  SpO2: 100%  RRR Lungs clear bilaterally  Psych: Pleasant and appropriate   Assessment & Plan Moderate cognitive impairment Stress and adjustment reaction Has preserved ADLs and instrumental ADLs Discussed at length Given that patient still drives, manages finances, recommend going through with English classes [phone] not completed at this  time given his ability to complete instrumental ADL Information given for English classes Connected with [facility] with RN Fernande  Tobacco use Counseled on cessation  Primary hypertension Not at goal Discussed lifestyle changes Compliant with medications Has previously been at goal--consider adding ARB in future  Does not have health insurance Discussed resources, connecting with [facility] for Employment   HCM- Flu and PCV20 once he has insurance   Suzann Daring, MD  [facility] Medicine Teaching Service

## 2024-06-25 ENCOUNTER — Encounter: Payer: Self-pay | Admitting: Pharmacist

## 2024-06-25 ENCOUNTER — Ambulatory Visit: Payer: Self-pay

## 2024-06-25 ENCOUNTER — Other Ambulatory Visit: Payer: Self-pay

## 2024-06-25 ENCOUNTER — Encounter: Payer: Self-pay | Admitting: Pediatric Intensive Care

## 2024-06-25 ENCOUNTER — Ambulatory Visit (INDEPENDENT_AMBULATORY_CARE_PROVIDER_SITE_OTHER): Payer: Self-pay | Admitting: Pharmacist

## 2024-06-25 VITALS — BP 130/90 | HR 87 | Ht 71.0 in | Wt 228.4 lb

## 2024-06-25 DIAGNOSIS — R4189 Other symptoms and signs involving cognitive functions and awareness: Secondary | ICD-10-CM

## 2024-06-25 DIAGNOSIS — Z720 Tobacco use: Secondary | ICD-10-CM

## 2024-06-25 DIAGNOSIS — L409 Psoriasis, unspecified: Secondary | ICD-10-CM

## 2024-06-25 DIAGNOSIS — I10 Essential (primary) hypertension: Secondary | ICD-10-CM

## 2024-06-25 DIAGNOSIS — Z5971 Insufficient health insurance coverage: Secondary | ICD-10-CM

## 2024-06-25 DIAGNOSIS — F4329 Adjustment disorder with other symptoms: Secondary | ICD-10-CM

## 2024-06-25 DIAGNOSIS — Z5181 Encounter for therapeutic drug level monitoring: Secondary | ICD-10-CM

## 2024-06-25 DIAGNOSIS — Z79631 Long term (current) use of antimetabolite agent: Secondary | ICD-10-CM

## 2024-06-25 MED ORDER — AMLODIPINE-OLMESARTAN 5-40 MG PO TABS
1.0000 | ORAL_TABLET | Freq: Every day | ORAL | 3 refills | Status: DC
  Filled 2024-06-25: qty 90, 90d supply, fill #0

## 2024-06-25 MED ORDER — METHOTREXATE SODIUM 2.5 MG PO TABS
10.0000 mg | ORAL_TABLET | ORAL | 1 refills | Status: AC
  Filled 2024-06-25 – 2024-07-16 (×2): qty 16, 28d supply, fill #0
  Filled 2024-08-26: qty 16, 28d supply, fill #1

## 2024-06-25 MED ORDER — AMLODIPINE BESYLATE 5 MG PO TABS
5.0000 mg | ORAL_TABLET | Freq: Every day | ORAL | 3 refills | Status: DC
  Filled 2024-06-25: qty 90, 90d supply, fill #0

## 2024-06-25 NOTE — Patient Instructions (Signed)
 It was nice to see you today! Your new pharmacy is [facility]. Your medications can be picked up there.   Address: [address] [place].

## 2024-06-25 NOTE — Assessment & Plan Note (Signed)
 Has preserved ADLs and instrumental ADLs Discussed at length Given that patient still drives, manages finances, recommend going through with English classes [phone] not completed at this time given his ability to complete instrumental ADL Information given for English classes Connected with [facility] with RN Fernande

## 2024-06-25 NOTE — Patient Instructions (Addendum)
 It was wonderful to see you today.  Please bring ALL of your medications with you to every visit.   Today we talked about:  I recommend quitting smoking   Please continue all of your medications  Nurse Richerd will reach out with information about [facility] for [facility])  [employer]--I recommend going there for English classes   [address] [place], [place], [place] [address]   Thank you for choosing [facility].   Please call [phone] with any questions about today's appointment.  Please be sure to schedule follow up at the front  desk before you leave today.   Suzann Daring, MD  Family Medicine    ????? ??????? ?????.  ???? ????? ???? ??????? ???? ?? ?? ?????.  ?????? ????? ??:  ???? ???????? ?? ???????.  ???? ????????? ?? ????? ???? ???????.  ??????? ??????? ???????? ???? ???????? ???????? ??? ???? [facility] (???? ???? ????? ???????? ???????? ?????).  ???????? ????? - ???? ??????? ??? ???? ????? ???? ????? ??????????.  2714 ??? ???? ?????? ?????????? ????????? ???????? [id number]  ????? ????????? ???? ??? ???? ??? ??????.  ???? ??????? ??? ????? [id number] ??? ????????? ??? ???? ?????.  ???? ?????? ?? ??? ???? ???????? ?? ???? ????????? ??? ???????? ?????.  ?????? ?????? ??????? ?? ???? ?? ?????? srrt biruyatikum alyawma. yurjaa 'iihdar jamie 'adwiatikum maeakum fi kuli ziaratin. tahadathna alyawm ean: 'ansah bial'iiqlae ean altadkhini. yurjaa aliastimrar fi tanawul jamie 'adwiatikim. satatawasal almumaridat fikturya maeakum litazwidikum bimaelumat hawl markaz [facility] (markaz sukaan wilayat karwlyna alshamaliat aljuddi). alwafidun aljudud - 'ansah bialdhahab 'iilaa hunak lihudur durus allughat al'iinjiliziati. [address] [place], [place] [id number] shkran liaikhtiarikum darrell kawn hilth litibi al'usrati. yurjaa aliatisal ealaa alraqm [id number] li'ayi astifsarat hawl maweid alyawmi. yurjaa alta'akud min  hajz maweid lilmutabaeat fi maktab aliastiqbal qabl mughadaratikum alyawma. rufus brawn, dukturah fi altibi tibu al'usra

## 2024-06-25 NOTE — Assessment & Plan Note (Addendum)
 Counseled on cessation

## 2024-06-25 NOTE — Assessment & Plan Note (Addendum)
 Not at goal Discussed lifestyle changes Compliant with medications Has previously been at goal--consider adding ARB in future

## 2024-06-25 NOTE — Assessment & Plan Note (Addendum)
 Patient reported he paid $200 for 4 pills (1-week supply) of methotrexate . [facility] pharmacy was contacted and they shared that his last pick up of methotrexate  on 06/17/24 is $17.13 for 28-day supply. Contacted [facility] to inquire his medication cost: methotrexate  $9, $0 foamlodipine , folic acid  and metoprolol  since he does not have insurance.  -Shared with patient the cost of his medication at [facility]. Patient is happy to switch to [facility] for future fills.  -Dr. Delores is aware of plan and has sent over his prescriptions to [facility] pharmacy.

## 2024-06-25 NOTE — Progress Notes (Signed)
    S:     Chief Complaint  Patient presents with   Medication Management    Medication Assistance   63 y.o. who presents for medication management due to concern of polypharmacy.  Patient arrives in good spirits and presents with assistance of an interpreter .  Patient was referred and last seen by Primary Care Provider, Dr. Nicholas, on 06/17/2024.   PMH is significant for hypertension, psoriasis, tobacco use, moderate cognitive impairment.   Insurance Coverage: None  O:  Review of Systems  All other systems reviewed and are negative.  Physical Exam Constitutional:      Appearance: Normal appearance.  Pulmonary:     Effort: Pulmonary effort is normal.  Neurological:     Mental Status: He is alert.  Psychiatric:        Mood and Affect: Mood normal.        Behavior: Behavior normal.        Thought Content: Thought content normal.        Judgment: Judgment normal.    A/P: Patient reported he paid $200 for 4 pills (1-week supply) of methotrexate . [facility] pharmacy was contacted and they shared that his last pick up of methotrexate  on 06/17/24 is $17.13 for 28-day supply. Contacted  pharmacy at [facility] to inquire his medication cost: methotrexate  $9, $0 foamlodipine , folic acid  and metoprolol  since he does not have insurance.  -Shared with patient the cost of his medication at [employer] [facility]. Patient is happy to switch to [facility] at [facility] for future fills.  -Dr. Delores is aware of plan and has sent over his prescriptions to [facility] pharmacy.   Written patient instructions and updated medication list provided. Total time in face to face counseling 15 minutes.    PCP Clinic Visit 07/03/2024 Patient seen with Recardo Purdue PharmD - PY4 Candidate.

## 2024-06-25 NOTE — Congregational Nurse Program (Signed)
  Dept: [phone]   Congregational Nurse Program Note  Date of Encounter: 06/25/2024  Past Medical History: Past Medical History:  Diagnosis Date   High cholesterol    Hypertension    Prediabetes    Seasonal allergies    Tobacco use     Encounter Details:Initial meeting with client. Client is currently unemployed. Given referral letter for NA[employer] to attend English classes. Shared cell number and will also place referral for [facility]. Richerd Plana BSN RN CNNP [phone]

## 2024-06-25 NOTE — Assessment & Plan Note (Addendum)
 Has preserved ADLs and instrumental ADLs Discussed at length Given that patient still drives, manages finances, recommend going through with English classes [phone] not completed at this time given his ability to complete instrumental ADL Information given for English classes Connected with [facility] with RN Fernande

## 2024-06-27 NOTE — Progress Notes (Signed)
 Reviewed and agree with Dr Rennis plan.

## 2024-07-01 ENCOUNTER — Ambulatory Visit: Payer: Self-pay

## 2024-07-03 ENCOUNTER — Encounter: Payer: Self-pay | Admitting: Family Medicine

## 2024-07-03 ENCOUNTER — Ambulatory Visit: Payer: Self-pay | Admitting: Family Medicine

## 2024-07-03 VITALS — BP 125/90 | HR 76 | Wt 230.2 lb

## 2024-07-03 DIAGNOSIS — Z23 Encounter for immunization: Secondary | ICD-10-CM

## 2024-07-03 DIAGNOSIS — Z79631 Long term (current) use of antimetabolite agent: Secondary | ICD-10-CM

## 2024-07-03 DIAGNOSIS — Z5181 Encounter for therapeutic drug level monitoring: Secondary | ICD-10-CM

## 2024-07-03 DIAGNOSIS — I10 Essential (primary) hypertension: Secondary | ICD-10-CM

## 2024-07-03 MED ORDER — AMLODIPINE BESYLATE 10 MG PO TABS
10.0000 mg | ORAL_TABLET | Freq: Every day | ORAL | 0 refills | Status: AC
Start: 2024-07-03 — End: ?

## 2024-07-03 NOTE — Assessment & Plan Note (Signed)
 Blood pressure slightly elevated at 131/89 mmHg, above target of less than 130/80 mmHg. - Increased amlodipine  5 mg to amlodipine  10 mg

## 2024-07-03 NOTE — Patient Instructions (Addendum)
????? ?????? ?????.  ???? ????? ???? ?????? ??? ?? ?? ?????.  ?????? ????? ??:  ??????? - ??? ????? ???? ????? ??????. ?????? ?? ????? ???????????? ???????? ???? ??????? ??????. ????? ????? ?????? ??????? ?????. ????? ???? ?????? ??? ??? ??????? ????????? ??? ???? ?????? ??????? ????? ???????? ??? ????. ??? ?????? ????? ????? ?? ??????? ????????.  ????? ????????? ????? ??? ???? ??? ??????.  ???? ??????? ??? ?????   [id number] ??? ????????? ??? ???? ?????.  ????? ?????? ??????? ?? ???? ?? ??????  It was wonderful to see you today.  Please bring ALL of your medications with you to every visit.   Today we talked about:  Psoriasis - I am very glad your symptoms are starting to improve. Continue methotrexate  weekly and folic acid  daily. I will check your labs today. You have a follow up in about a month to check on your progress and check labs again. If you notice significant issues with your breathing do let us  know.     Thank you for choosing [facility].   Please call [phone] with any questions about today's appointment.   Areta Saliva, MD  Family Medicine

## 2024-07-03 NOTE — Progress Notes (Signed)
   SUBJECTIVE:   CHIEF COMPLAINT / HPI:  Discussed the use of AI scribe software for clinical note transcription with the patient, who gave verbal consent to proceed.  History of Present Illness Olman Tom-Mohamed is a 63 year old male who presents for follow-up and monitoring of methotrexate  treatment for psoriasis.  Psoriatic symptoms and treatment - Psoriasis managed with weekly methotrexate  and daily folic acid  supplementation. - Significant reduction in itching and pain since initiation of methotrexate  therapy. - Methotrexate  is now more affordable after meeting with Dr. Koval - No side effects   Hypertension management - Hypertension treated with amlodipine  5 mg. Taking daily  - Adequate supply of antihypertensive medication available.    PERTINENT  PMH / PSH: psoriasis   OBJECTIVE:  BP (!) 125/90   Pulse 76   Wt 230 lb 3.2 oz (104.4 kg)   SpO2 99%   BMI 32.11 kg/m   General: well appearing, in no acute distress Resp: Normal work of breathing on room air Neuro: Alert & Oriented  SKIN: Plaque psoriasis on arms and legs improved but still significant. Psoriasis on scalp significantly improved, nearly resolved. Pictures in media tab.  ASSESSMENT/PLAN:   Assessment & Plan Encounter for monitoring of methotrexate  therapy Psoriasis on arms, legs, and scalp improved with methotrexate . Itchiness and pain decreased. Scalp involvement nearly resolved. Awaiting dermatologist consultation for potential biologic therapy. - Continue methotrexate  10 mg oral weekly. - Continue folic acid  oral daily. - Ordered labs for methotrexate  monitoring.CBC CMP  - Follow up in one month for fu and labs  Primary hypertension Blood pressure slightly elevated at 131/89 mmHg, above target of less than 130/80 mmHg. - Increased amlodipine  5 mg to amlodipine  10 mg   Patient to get Flu and COVID vaccines at pharmacy since it will be more cost effective.    Areta Saliva, MD [facility]

## 2024-07-04 LAB — CBC WITH DIFFERENTIAL/PLATELET
Basophils Absolute: 0.1 x10E3/uL (ref 0.0–0.2)
Basos: 1 %
EOS (ABSOLUTE): 0.3 x10E3/uL (ref 0.0–0.4)
Eos: 5 %
Hematocrit: 44.7 % (ref 37.5–51.0)
Hemoglobin: 14.9 g/dL (ref 13.0–17.7)
Immature Grans (Abs): 0 x10E3/uL (ref 0.0–0.1)
Immature Granulocytes: 0 %
Lymphocytes Absolute: 1.8 x10E3/uL (ref 0.7–3.1)
Lymphs: 30 %
MCH: 31 pg (ref 26.6–33.0)
MCHC: 33.3 g/dL (ref 31.5–35.7)
MCV: 93 fL (ref 79–97)
Monocytes Absolute: 0.8 x10E3/uL (ref 0.1–0.9)
Monocytes: 12 %
Neutrophils Absolute: 3.1 x10E3/uL (ref 1.4–7.0)
Neutrophils: 51 %
Platelets: 243 x10E3/uL (ref 150–450)
RBC: 4.8 x10E6/uL (ref 4.14–5.80)
RDW: 12.6 % (ref 11.6–15.4)
WBC: 6.1 x10E3/uL (ref 3.4–10.8)

## 2024-07-04 LAB — COMPREHENSIVE METABOLIC PANEL WITH GFR
ALT: 25 IU/L (ref 0–44)
AST: 16 IU/L (ref 0–40)
Albumin: 4.2 g/dL (ref 3.9–4.9)
Alkaline Phosphatase: 89 IU/L (ref 47–123)
BUN/Creatinine Ratio: 10 (ref 10–24)
BUN: 9 mg/dL (ref 8–27)
Bilirubin Total: 0.2 mg/dL (ref 0.0–1.2)
CO2: 23 mmol/L (ref 20–29)
Calcium: 9.6 mg/dL (ref 8.6–10.2)
Chloride: 108 mmol/L — ABNORMAL HIGH (ref 96–106)
Creatinine, Ser: 0.9 mg/dL (ref 0.76–1.27)
Globulin, Total: 2.4 g/dL (ref 1.5–4.5)
Glucose: 83 mg/dL (ref 70–99)
Potassium: 4.3 mmol/L (ref 3.5–5.2)
Sodium: 145 mmol/L — ABNORMAL HIGH (ref 134–144)
Total Protein: 6.6 g/dL (ref 6.0–8.5)
eGFR: 96 mL/min/1.73 (ref >59)

## 2024-07-05 ENCOUNTER — Other Ambulatory Visit: Payer: Self-pay

## 2024-07-11 ENCOUNTER — Ambulatory Visit: Payer: Self-pay | Admitting: Family Medicine

## 2024-07-16 ENCOUNTER — Other Ambulatory Visit (HOSPITAL_COMMUNITY): Payer: Self-pay

## 2024-07-16 ENCOUNTER — Other Ambulatory Visit: Payer: Self-pay

## 2024-08-05 ENCOUNTER — Ambulatory Visit (INDEPENDENT_AMBULATORY_CARE_PROVIDER_SITE_OTHER): Payer: Self-pay

## 2024-08-05 VITALS — BP 124/88 | HR 98 | Ht 71.0 in | Wt 238.6 lb

## 2024-08-05 DIAGNOSIS — I10 Essential (primary) hypertension: Secondary | ICD-10-CM

## 2024-08-05 DIAGNOSIS — L409 Psoriasis, unspecified: Secondary | ICD-10-CM

## 2024-08-05 DIAGNOSIS — Z5181 Encounter for therapeutic drug level monitoring: Secondary | ICD-10-CM

## 2024-08-05 DIAGNOSIS — Z79631 Long term (current) use of antimetabolite agent: Secondary | ICD-10-CM

## 2024-08-05 NOTE — Assessment & Plan Note (Signed)
 Doing well on current treatment. - Continue methotrexate  10 mg weekly - Continue folic acid  1 mg daily - Will get CBC, CMP to monitor blood count and liver function - Plan to repeat in 1 month if normal - Consider spacing to 3-6 month monitoring following

## 2024-08-05 NOTE — Patient Instructions (Addendum)
??? ?????? ??? ????? ° °??? ?? ????? ????? ????? ?? ??????? ?????! ??? ????? ??????? ????? ????? ??????? ???? ?????????????. ?????? ?? ??? ?? ?????? ????? ????? ????! ????? ????? ??? ???????? ???????? ?????? ?? ????? ????? ?????? ???? ????? ????. ??????? ??? ?? ??? ???? ?? ????? ??? ??????. ° °??? ????? ?? ?????? ?? ????? ??? ????? ?? ??????? ?????? ???? ??????? ??????? ????? ??? ???? ??????? ?????? ??????? ??????. ° °????? ?? ??? ????? ?????? ?? ????? ????? ?? ???? ?????? ??????! °???????? ?????? ???????? °???? ??? ???? ??? ?????? °  1125 ???? ???? ?????? ?????????? ???? ???????? [phone]   eabd alqayuwm tum muhamad, kan min dawaei sururi ruyatuk fi aleiadat alyawma! laqad hdrt limutabaeat eilajik nzran litanawulak dawa' almithutriksat. yuseiduni 'an 'araa 'ana aleilaj yuhqq natayij jayidatan! sanujri alyawm baed alfuhusat almukhbiriat liltahaquq min wazayif alkulaa walkabid waeadad khalaya aldam. sanatawasal maeak fi hal wujud 'ayi natayij ghayr tabieiatin. laqad hddt lak mwedan fi nihayat shahr yanayir mae alduktur buina, tabib alrieayat al'awaliat alkhasi bika; satajid altaarikh walwaqt madhkurayn lahqan. shkran lak ealaa 'iitahat alfursat li li'akun jz'an min fariq rieayatik alsihiyati! aldukturat '[employer] nimitshik markaz kun hilath litibi al'usra [address] [place], [place], [place] [address] [phone]   .................................................................................................Jared  Caston Porter,   It was great seeing you in clinic today! You came in for medication monitoring given you are on methotrexate . I am glad this is doing well for you! We are doing labs today to check your kidney and liver function and your blood count. We will reach out if anything is abnormal.  I have made you an appointment for the end of January with Dr. Nicholas, your Primary Care Physician; the date/time is listed later.  Thank you for allowing me to  be a part of your care team! Alan Flies, MD [facility] [address] [place], [place], [place] [address] [phone]

## 2024-08-05 NOTE — Progress Notes (Signed)
" ° ° °  SUBJECTIVE:   CHIEF COMPLAINT / HPI:   Jared Porter is a 62 y.o. male presenting for medication monitoring given pt on methotrexate .  Psoriasis Pt on methotrexate  for his hx psoriasis; on weekly methotrexate  and daily folic acid  supplementation. Psoriasis is improving a lot with current medications. Still has some patches, but improved. Reduced size, itchiness, inflammation.  Hypertension Amlodipine  increase to 10 mg at last OV 07/03/24. Doing well on this new dose.  Healthcare Maintenance - Previously instructed to get flu and covid at outside pharmacy (more cost effective); pt not able at this time due to cost (waiting on Medicaid) - Pneumococcal vaccine - Shingrix vaccine   PERTINENT  PMH / PSH: Psoriasis, tobacco use, HTN, prediabetes  OBJECTIVE:   BP 124/88   Pulse 98   Ht 5' 11 (1.803 m)   Wt 238 lb 9.6 oz (108.2 kg)   SpO2 98%   BMI 33.28 kg/m    General: Patient seated in chair, no acute distress Respiratory: Normal work of breathing on room air. Skin: Psoriasis patch on inner right arm.   ASSESSMENT/PLAN:   Assessment & Plan Encounter for monitoring of methotrexate  therapy Psoriasis Doing well on current treatment. - Continue methotrexate  10 mg weekly - Continue folic acid  1 mg daily - Will get CBC, CMP to monitor blood count and liver function - Plan to repeat in 1 month if normal - Consider spacing to 3-6 month monitoring following Primary hypertension BP well controlled on current medication. - Continue amlodipine  10 mg daily  Follow-up with PCP on 09/02/24.  Alan Flies, MD [facility] "

## 2024-08-05 NOTE — Assessment & Plan Note (Signed)
 BP well controlled on current medication. - Continue amlodipine  10 mg daily

## 2024-08-08 ENCOUNTER — Ambulatory Visit: Payer: Self-pay

## 2024-08-08 LAB — CBC WITH DIFFERENTIAL/PLATELET
Basophils Absolute: 0.1 x10E3/uL (ref 0.0–0.2)
Basos: 1 %
EOS (ABSOLUTE): 0.3 x10E3/uL (ref 0.0–0.4)
Eos: 6 %
Hematocrit: 41.4 % (ref 37.5–51.0)
Hemoglobin: 14.2 g/dL (ref 13.0–17.7)
Immature Grans (Abs): 0 x10E3/uL (ref 0.0–0.1)
Immature Granulocytes: 0 %
Lymphocytes Absolute: 1.8 x10E3/uL (ref 0.7–3.1)
Lymphs: 31 %
MCH: 31.6 pg (ref 26.6–33.0)
MCHC: 34.3 g/dL (ref 31.5–35.7)
MCV: 92 fL (ref 79–97)
Monocytes Absolute: 0.7 x10E3/uL (ref 0.1–0.9)
Monocytes: 11 %
Neutrophils Absolute: 2.9 x10E3/uL (ref 1.4–7.0)
Neutrophils: 50 %
Platelets: 246 x10E3/uL (ref 150–450)
RBC: 4.5 x10E6/uL (ref 4.14–5.80)
RDW: 12.7 % (ref 11.6–15.4)
WBC: 5.8 x10E3/uL (ref 3.4–10.8)

## 2024-08-08 LAB — COMPREHENSIVE METABOLIC PANEL WITH GFR
ALT: 36 IU/L (ref 0–44)
AST: 21 IU/L (ref 0–40)
Albumin: 4.3 g/dL (ref 3.9–4.9)
Alkaline Phosphatase: 86 IU/L (ref 47–123)
BUN/Creatinine Ratio: 11 (ref 10–24)
BUN: 10 mg/dL (ref 8–27)
Bilirubin Total: 0.2 mg/dL (ref 0.0–1.2)
CO2: 17 mmol/L — ABNORMAL LOW (ref 20–29)
Calcium: 9.6 mg/dL (ref 8.6–10.2)
Chloride: 107 mmol/L — ABNORMAL HIGH (ref 96–106)
Creatinine, Ser: 0.9 mg/dL (ref 0.76–1.27)
Globulin, Total: 2.4 g/dL (ref 1.5–4.5)
Glucose: 129 mg/dL — ABNORMAL HIGH (ref 70–99)
Potassium: 3.8 mmol/L (ref 3.5–5.2)
Sodium: 142 mmol/L (ref 134–144)
Total Protein: 6.7 g/dL (ref 6.0–8.5)
eGFR: 96 mL/min/1.73

## 2024-08-26 ENCOUNTER — Other Ambulatory Visit: Payer: Self-pay

## 2024-09-02 ENCOUNTER — Ambulatory Visit: Payer: Self-pay | Admitting: Family Medicine

## 2024-12-23 ENCOUNTER — Ambulatory Visit: Payer: Self-pay | Admitting: Physician Assistant
